# Patient Record
Sex: Female | Born: 1975
Health system: Southern US, Community
[De-identification: ages and names within clinical notes are randomized; demographics above are authoritative.]

## PROBLEM LIST (undated history)

## (undated) DIAGNOSIS — Z803 Family history of malignant neoplasm of breast: Secondary | ICD-10-CM

## (undated) DIAGNOSIS — C439 Malignant melanoma of skin, unspecified: Secondary | ICD-10-CM

## (undated) DIAGNOSIS — Z800 Family history of malignant neoplasm of digestive organs: Secondary | ICD-10-CM

## (undated) DIAGNOSIS — Z808 Family history of malignant neoplasm of other organs or systems: Secondary | ICD-10-CM

## (undated) HISTORY — PX: APPENDECTOMY: SHX54

## (undated) HISTORY — DX: Family history of malignant neoplasm of breast: Z80.3

## (undated) HISTORY — DX: Family history of malignant neoplasm of digestive organs: Z80.0

## (undated) HISTORY — DX: Family history of malignant neoplasm of other organs or systems: Z80.8

## (undated) HISTORY — PX: CHOLECYSTECTOMY: SHX55

---

## 2007-05-11 ENCOUNTER — Emergency Department (HOSPITAL_COMMUNITY): Admission: EM | Admit: 2007-05-11 | Discharge: 2007-05-11 | Payer: Self-pay | Admitting: Emergency Medicine

## 2007-08-30 ENCOUNTER — Other Ambulatory Visit: Admission: RE | Admit: 2007-08-30 | Discharge: 2007-08-30 | Payer: Self-pay | Admitting: Family Medicine

## 2009-10-13 ENCOUNTER — Emergency Department (HOSPITAL_BASED_OUTPATIENT_CLINIC_OR_DEPARTMENT_OTHER): Admission: EM | Admit: 2009-10-13 | Discharge: 2009-10-13 | Payer: Self-pay | Admitting: Emergency Medicine

## 2009-10-13 ENCOUNTER — Ambulatory Visit: Payer: Self-pay | Admitting: Diagnostic Radiology

## 2010-12-28 LAB — OVA AND PARASITE EXAMINATION

## 2010-12-28 LAB — URINALYSIS, ROUTINE W REFLEX MICROSCOPIC
Bilirubin Urine: NEGATIVE
Glucose, UA: NEGATIVE mg/dL
Hgb urine dipstick: NEGATIVE
Ketones, ur: NEGATIVE mg/dL
pH: 7 (ref 5.0–8.0)

## 2010-12-28 LAB — CBC
Hemoglobin: 14 g/dL (ref 12.0–15.0)
Platelets: 224 10*3/uL (ref 150–400)
RBC: 4.74 MIL/uL (ref 3.87–5.11)

## 2010-12-28 LAB — BASIC METABOLIC PANEL
BUN: 13 mg/dL (ref 6–23)
CO2: 22 mEq/L (ref 19–32)
Calcium: 9.7 mg/dL (ref 8.4–10.5)
Chloride: 106 mEq/L (ref 96–112)
Creatinine, Ser: 0.8 mg/dL (ref 0.4–1.2)
Glucose, Bld: 102 mg/dL — ABNORMAL HIGH (ref 70–99)

## 2010-12-28 LAB — DIFFERENTIAL
Basophils Absolute: 0.5 10*3/uL — ABNORMAL HIGH (ref 0.0–0.1)
Basophils Relative: 2 % — ABNORMAL HIGH (ref 0–1)
Lymphs Abs: 1.2 10*3/uL (ref 0.7–4.0)
Monocytes Absolute: 1.3 10*3/uL — ABNORMAL HIGH (ref 0.1–1.0)

## 2010-12-28 LAB — STOOL CULTURE

## 2010-12-28 LAB — URINE MICROSCOPIC-ADD ON

## 2010-12-28 LAB — PREGNANCY, URINE: Preg Test, Ur: NEGATIVE

## 2014-11-06 ENCOUNTER — Emergency Department (HOSPITAL_BASED_OUTPATIENT_CLINIC_OR_DEPARTMENT_OTHER): Payer: Worker's Compensation

## 2014-11-06 ENCOUNTER — Emergency Department (HOSPITAL_BASED_OUTPATIENT_CLINIC_OR_DEPARTMENT_OTHER)
Admission: EM | Admit: 2014-11-06 | Discharge: 2014-11-06 | Disposition: A | Discharge status: Home / Self Care | Payer: Worker's Compensation | Attending: Emergency Medicine | Admitting: Emergency Medicine

## 2014-11-06 ENCOUNTER — Encounter (HOSPITAL_BASED_OUTPATIENT_CLINIC_OR_DEPARTMENT_OTHER): Payer: Self-pay | Admitting: *Deleted

## 2014-11-06 DIAGNOSIS — Z79899 Other long term (current) drug therapy: Secondary | ICD-10-CM | POA: Diagnosis not present

## 2014-11-06 DIAGNOSIS — S060X0A Concussion without loss of consciousness, initial encounter: Secondary | ICD-10-CM

## 2014-11-06 DIAGNOSIS — W07XXXA Fall from chair, initial encounter: Secondary | ICD-10-CM | POA: Diagnosis not present

## 2014-11-06 DIAGNOSIS — Y990 Civilian activity done for income or pay: Secondary | ICD-10-CM | POA: Insufficient documentation

## 2014-11-06 DIAGNOSIS — H538 Other visual disturbances: Secondary | ICD-10-CM | POA: Insufficient documentation

## 2014-11-06 DIAGNOSIS — Y9389 Activity, other specified: Secondary | ICD-10-CM | POA: Diagnosis not present

## 2014-11-06 DIAGNOSIS — Y9289 Other specified places as the place of occurrence of the external cause: Secondary | ICD-10-CM | POA: Diagnosis not present

## 2014-11-06 DIAGNOSIS — S0990XA Unspecified injury of head, initial encounter: Secondary | ICD-10-CM | POA: Diagnosis present

## 2014-11-06 DIAGNOSIS — Z85820 Personal history of malignant melanoma of skin: Secondary | ICD-10-CM | POA: Diagnosis not present

## 2014-11-06 HISTORY — DX: Malignant melanoma of skin, unspecified: C43.9

## 2014-11-06 MED ORDER — IBUPROFEN 800 MG PO TABS
800.0000 mg | ORAL_TABLET | Freq: Once | ORAL | Status: AC
  Administered 2014-11-06: 800 mg via ORAL
  Filled 2014-11-06: qty 1

## 2014-11-06 NOTE — ED Notes (Signed)
Fell off stool at work and hit head, denies LOC. C/o nausea and dizziness.

## 2014-11-06 NOTE — ED Provider Notes (Signed)
CSN: [id number]     Arrival date & time 11/06/14  1201 History   First MD Initiated Contact with Patient 11/06/14 1217     Chief Complaint  Patient presents with  . Head Injury    Patient is a 39 y.o. female presenting with head injury. The history is provided by the patient.  Head Injury Location:  Occipital Mechanism of injury: fall   Pain details:    Quality:  Aching   Severity:  Severe   Timing:  Constant   Progression:  Worsening Chronicity:  New Relieved by:  Nothing Worsened by:  Light and movement Associated symptoms: blurred vision, headache and nausea   Associated symptoms: no double vision, no focal weakness, no loss of consciousness, no neck pain, no seizures and no vomiting   Patient reports that while at work earlier this morning the chair broke and she fell back hitting her head on the wall.  No LOC.  No vomiting.  No focal weakness.  She does not take anticoagulants.  She reports severe HA/dizziness.  She reports h/o migraines She works at a private school.  She was instructed to be seen in the ER  Past Medical History  Diagnosis Date  . Melanoma    Past Surgical History  Procedure Laterality Date  . Cholecystectomy    . Appendectomy    . Cesarean section     History reviewed. No pertinent family history. History  Substance Use Topics  . Smoking status: Never Smoker   . Smokeless tobacco: Not on file  . Alcohol Use: No   OB History    No data available     Review of Systems  Eyes: Positive for blurred vision and visual disturbance. Negative for double vision.  Cardiovascular: Negative for chest pain.  Gastrointestinal: Positive for nausea. Negative for vomiting.  Musculoskeletal: Negative for neck pain.  Neurological: Positive for dizziness and headaches. Negative for focal weakness, seizures, loss of consciousness and syncope.  All other systems reviewed and are negative.     Allergies  Reglan  Home Medications   Prior to Admission  medications   Medication Sig Start Date End Date Taking? Authorizing Provider  cetirizine (ZYRTEC) 10 MG tablet Take 10 mg by mouth daily.   Yes Historical Provider, MD  FLUoxetine (PROZAC) 20 MG capsule Take 20 mg by mouth daily.   Yes Historical Provider, MD  Melatonin 3 MG CAPS Take by mouth.   Yes Historical Provider, MD   BP 132/73 mmHg  Pulse 62  Temp(Src) 98.7 F (37.1 C) (Oral)  Resp 20  Ht 5\' 5"  (1.651 m)  Wt 245 lb (111.131 kg)  BMI 40.77 kg/m2  SpO2 99%  LMP 10/31/2014 Physical Exam CONSTITUTIONAL: Well developed/well nourished HEAD: Normocephalic/atraumatic, tenderness to occiput EYES: EOMI/PERRL, no nystagmus, no ptosis ENMT: Mucous membranes moist NECK: supple no meningeal signs, no bruits SPINE/BACK:entire spine nontender CV: S1/S2 noted, no murmurs/rubs/gallops noted LUNGS: Lungs are clear to auscultation bilaterally, no apparent distress ABDOMEN: soft, nontender, no rebound or guarding GU:no cva tenderness NEURO:Awake/alert, facies symmetric, no arm or leg drift is noted Equal 5/5 strength with shoulder abduction, elbow flex/extension, wrist flex/extension in upper extremities and equal hand grips bilaterally Equal 5/5 strength with hip flexion,knee flex/extension, foot dorsi/plantar flexion Cranial nerves 3/4/5/6/04/19/09/11/12 tested and intact Gait normal without ataxia No past pointing Sensation to light touch intact in all extremities EXTREMITIES: pulses normal, full ROM SKIN: warm, color normal PSYCH: no abnormalities of mood noted, alert and oriented to situation  ED Course  Procedures   ADVISED PATIENT THAT GIVEN MECHANISM, NO LOC, NO VOMITING WITH UNREMARKABLE NEURO EXAM, IT IS HIGHLY UNLIKELY SHE HAS ANY ACUTE NEUROSURGICAL EMERGENCY AND WE COULD TREAT HER HEADACHE AS A MIGRAINE.  ADVISED OF RISK OF CT SCANNING INCLUDING RADIATION EXPOSURE.  SHE REPORTS HER JOB WANTS HER TO HAVE CT HEAD AND SHE DEMANDS CT HEAD.  CT HEAD NEGATIVE ADVISED F/U WITH  NEUROLOGY FOR LIKELY CONCUSSION AS WELL F/U WITH OCCUPATIONAL HEALTH Imaging Review Ct Head Wo Contrast  11/06/2014   CLINICAL DATA:  [place] off stool at work and hit head on the wall , denies LOC. C/o nausea and dizziness.  EXAM: CT HEAD WITHOUT CONTRAST  TECHNIQUE: Contiguous axial images were obtained from the base of the skull through the vertex without intravenous contrast.  COMPARISON:  None.  FINDINGS: There is no evidence of mass effect, midline shift or extra-axial fluid collections. There is no evidence of a space-occupying lesion or intracranial hemorrhage. There is no evidence of a cortical-based area of acute infarction.  The ventricles and sulci are appropriate for the patient's age. The basal cisterns are patent.  Visualized portions of the orbits are unremarkable. The visualized portions of the paranasal sinuses and mastoid air cells are unremarkable.  The osseous structures are unremarkable.  IMPRESSION: Normal CT of the brain without intravenous contrast.   Electronically Signed   By: Kathreen Devoid   On: 11/06/2014 13:20   Medications  ibuprofen (ADVIL,MOTRIN) tablet 800 mg (800 mg Oral Given 11/06/14 1258)     MDM   Final diagnoses:  Concussion, without loss of consciousness, initial encounter    Nursing notes including past medical history and social history reviewed and considered in documentation     Sharyon Cable, MD 11/06/14 1418

## 2014-11-06 NOTE — Discharge Instructions (Signed)

## 2014-11-06 NOTE — ED Notes (Signed)
MD at bedside. 

## 2014-11-16 ENCOUNTER — Other Ambulatory Visit (HOSPITAL_BASED_OUTPATIENT_CLINIC_OR_DEPARTMENT_OTHER): Payer: Self-pay | Admitting: Physician Assistant

## 2014-11-16 ENCOUNTER — Ambulatory Visit (HOSPITAL_BASED_OUTPATIENT_CLINIC_OR_DEPARTMENT_OTHER)
Admission: RE | Admit: 2014-11-16 | Discharge: 2014-11-16 | Disposition: A | Discharge status: Home / Self Care | Payer: Worker's Compensation | Source: Ambulatory Visit | Attending: Physician Assistant | Admitting: Physician Assistant

## 2014-11-16 DIAGNOSIS — S0990XA Unspecified injury of head, initial encounter: Secondary | ICD-10-CM | POA: Diagnosis not present

## 2014-11-16 DIAGNOSIS — W1809XA Striking against other object with subsequent fall, initial encounter: Secondary | ICD-10-CM | POA: Insufficient documentation

## 2016-11-20 DIAGNOSIS — H10413 Chronic giant papillary conjunctivitis, bilateral: Secondary | ICD-10-CM | POA: Diagnosis not present

## 2017-01-27 DIAGNOSIS — Z1283 Encounter for screening for malignant neoplasm of skin: Secondary | ICD-10-CM | POA: Diagnosis not present

## 2017-01-27 DIAGNOSIS — Z08 Encounter for follow-up examination after completed treatment for malignant neoplasm: Secondary | ICD-10-CM | POA: Diagnosis not present

## 2017-01-27 DIAGNOSIS — Z85820 Personal history of malignant melanoma of skin: Secondary | ICD-10-CM | POA: Diagnosis not present

## 2017-04-21 DIAGNOSIS — Z85820 Personal history of malignant melanoma of skin: Secondary | ICD-10-CM | POA: Diagnosis not present

## 2017-04-21 DIAGNOSIS — Z08 Encounter for follow-up examination after completed treatment for malignant neoplasm: Secondary | ICD-10-CM | POA: Diagnosis not present

## 2017-05-12 DIAGNOSIS — Z0000 Encounter for general adult medical examination without abnormal findings: Secondary | ICD-10-CM | POA: Diagnosis not present

## 2017-05-12 DIAGNOSIS — E7800 Pure hypercholesterolemia, unspecified: Secondary | ICD-10-CM | POA: Diagnosis not present

## 2017-06-18 DIAGNOSIS — J019 Acute sinusitis, unspecified: Secondary | ICD-10-CM | POA: Diagnosis not present

## 2017-06-24 DIAGNOSIS — J019 Acute sinusitis, unspecified: Secondary | ICD-10-CM | POA: Diagnosis not present

## 2017-07-21 DIAGNOSIS — Z85820 Personal history of malignant melanoma of skin: Secondary | ICD-10-CM | POA: Diagnosis not present

## 2017-07-21 DIAGNOSIS — Z1283 Encounter for screening for malignant neoplasm of skin: Secondary | ICD-10-CM | POA: Diagnosis not present

## 2017-07-21 DIAGNOSIS — Z08 Encounter for follow-up examination after completed treatment for malignant neoplasm: Secondary | ICD-10-CM | POA: Diagnosis not present

## 2017-07-23 DIAGNOSIS — H5213 Myopia, bilateral: Secondary | ICD-10-CM | POA: Diagnosis not present

## 2017-10-20 DIAGNOSIS — D225 Melanocytic nevi of trunk: Secondary | ICD-10-CM | POA: Diagnosis not present

## 2017-10-20 DIAGNOSIS — Z85820 Personal history of malignant melanoma of skin: Secondary | ICD-10-CM | POA: Diagnosis not present

## 2017-10-20 DIAGNOSIS — Z1283 Encounter for screening for malignant neoplasm of skin: Secondary | ICD-10-CM | POA: Diagnosis not present

## 2017-10-20 DIAGNOSIS — D485 Neoplasm of uncertain behavior of skin: Secondary | ICD-10-CM | POA: Diagnosis not present

## 2017-10-20 DIAGNOSIS — Z08 Encounter for follow-up examination after completed treatment for malignant neoplasm: Secondary | ICD-10-CM | POA: Diagnosis not present

## 2017-11-03 DIAGNOSIS — D485 Neoplasm of uncertain behavior of skin: Secondary | ICD-10-CM | POA: Diagnosis not present

## 2017-11-03 DIAGNOSIS — L988 Other specified disorders of the skin and subcutaneous tissue: Secondary | ICD-10-CM | POA: Diagnosis not present

## 2017-12-09 DIAGNOSIS — M5432 Sciatica, left side: Secondary | ICD-10-CM | POA: Diagnosis not present

## 2017-12-09 DIAGNOSIS — M5431 Sciatica, right side: Secondary | ICD-10-CM | POA: Diagnosis not present

## 2017-12-09 DIAGNOSIS — M5481 Occipital neuralgia: Secondary | ICD-10-CM | POA: Diagnosis not present

## 2018-01-12 DIAGNOSIS — Z85820 Personal history of malignant melanoma of skin: Secondary | ICD-10-CM | POA: Diagnosis not present

## 2018-01-12 DIAGNOSIS — Z08 Encounter for follow-up examination after completed treatment for malignant neoplasm: Secondary | ICD-10-CM | POA: Diagnosis not present

## 2018-01-12 DIAGNOSIS — Z1283 Encounter for screening for malignant neoplasm of skin: Secondary | ICD-10-CM | POA: Diagnosis not present

## 2018-03-02 DIAGNOSIS — M5432 Sciatica, left side: Secondary | ICD-10-CM | POA: Diagnosis not present

## 2018-03-02 DIAGNOSIS — M5431 Sciatica, right side: Secondary | ICD-10-CM | POA: Diagnosis not present

## 2018-03-02 DIAGNOSIS — M5481 Occipital neuralgia: Secondary | ICD-10-CM | POA: Diagnosis not present

## 2018-03-25 DIAGNOSIS — M5481 Occipital neuralgia: Secondary | ICD-10-CM | POA: Diagnosis not present

## 2018-03-25 DIAGNOSIS — M5432 Sciatica, left side: Secondary | ICD-10-CM | POA: Diagnosis not present

## 2018-03-25 DIAGNOSIS — M5431 Sciatica, right side: Secondary | ICD-10-CM | POA: Diagnosis not present

## 2018-03-28 DIAGNOSIS — M5431 Sciatica, right side: Secondary | ICD-10-CM | POA: Diagnosis not present

## 2018-03-28 DIAGNOSIS — M5432 Sciatica, left side: Secondary | ICD-10-CM | POA: Diagnosis not present

## 2018-03-28 DIAGNOSIS — M5481 Occipital neuralgia: Secondary | ICD-10-CM | POA: Diagnosis not present

## 2018-04-13 ENCOUNTER — Other Ambulatory Visit: Payer: Self-pay | Admitting: Family Medicine

## 2018-04-13 ENCOUNTER — Other Ambulatory Visit (HOSPITAL_COMMUNITY)
Admission: RE | Admit: 2018-04-13 | Discharge: 2018-04-13 | Disposition: A | Discharge status: Home / Self Care | Payer: Commercial Managed Care - PPO | Source: Ambulatory Visit | Attending: Family Medicine | Admitting: Family Medicine

## 2018-04-13 DIAGNOSIS — Z124 Encounter for screening for malignant neoplasm of cervix: Secondary | ICD-10-CM | POA: Insufficient documentation

## 2018-04-13 DIAGNOSIS — E7800 Pure hypercholesterolemia, unspecified: Secondary | ICD-10-CM | POA: Diagnosis not present

## 2018-04-13 DIAGNOSIS — Z0000 Encounter for general adult medical examination without abnormal findings: Secondary | ICD-10-CM | POA: Diagnosis not present

## 2018-04-13 DIAGNOSIS — Z1231 Encounter for screening mammogram for malignant neoplasm of breast: Secondary | ICD-10-CM

## 2018-04-21 LAB — CYTOLOGY - PAP
DIAGNOSIS: NEGATIVE
HPV: NOT DETECTED

## 2018-05-09 ENCOUNTER — Ambulatory Visit
Admission: RE | Admit: 2018-05-09 | Discharge: 2018-05-09 | Disposition: A | Discharge status: Home / Self Care | Payer: Commercial Managed Care - PPO | Source: Ambulatory Visit | Attending: Family Medicine | Admitting: Family Medicine

## 2018-05-09 DIAGNOSIS — Z1231 Encounter for screening mammogram for malignant neoplasm of breast: Secondary | ICD-10-CM

## 2018-05-16 DIAGNOSIS — Z08 Encounter for follow-up examination after completed treatment for malignant neoplasm: Secondary | ICD-10-CM | POA: Diagnosis not present

## 2018-05-16 DIAGNOSIS — Z85820 Personal history of malignant melanoma of skin: Secondary | ICD-10-CM | POA: Diagnosis not present

## 2018-05-16 DIAGNOSIS — Z1283 Encounter for screening for malignant neoplasm of skin: Secondary | ICD-10-CM | POA: Diagnosis not present

## 2018-06-23 DIAGNOSIS — M5432 Sciatica, left side: Secondary | ICD-10-CM | POA: Diagnosis not present

## 2018-06-23 DIAGNOSIS — M5431 Sciatica, right side: Secondary | ICD-10-CM | POA: Diagnosis not present

## 2018-06-23 DIAGNOSIS — M5481 Occipital neuralgia: Secondary | ICD-10-CM | POA: Diagnosis not present

## 2018-06-24 DIAGNOSIS — M5431 Sciatica, right side: Secondary | ICD-10-CM | POA: Diagnosis not present

## 2018-06-24 DIAGNOSIS — M5432 Sciatica, left side: Secondary | ICD-10-CM | POA: Diagnosis not present

## 2018-06-24 DIAGNOSIS — M5481 Occipital neuralgia: Secondary | ICD-10-CM | POA: Diagnosis not present

## 2018-06-27 DIAGNOSIS — M5481 Occipital neuralgia: Secondary | ICD-10-CM | POA: Diagnosis not present

## 2018-06-27 DIAGNOSIS — M5432 Sciatica, left side: Secondary | ICD-10-CM | POA: Diagnosis not present

## 2018-06-27 DIAGNOSIS — M5431 Sciatica, right side: Secondary | ICD-10-CM | POA: Diagnosis not present

## 2018-07-26 DIAGNOSIS — H52222 Regular astigmatism, left eye: Secondary | ICD-10-CM | POA: Diagnosis not present

## 2018-07-26 DIAGNOSIS — H5213 Myopia, bilateral: Secondary | ICD-10-CM | POA: Diagnosis not present

## 2018-07-26 DIAGNOSIS — H10413 Chronic giant papillary conjunctivitis, bilateral: Secondary | ICD-10-CM | POA: Diagnosis not present

## 2018-12-07 DIAGNOSIS — Z08 Encounter for follow-up examination after completed treatment for malignant neoplasm: Secondary | ICD-10-CM | POA: Diagnosis not present

## 2018-12-07 DIAGNOSIS — L821 Other seborrheic keratosis: Secondary | ICD-10-CM | POA: Diagnosis not present

## 2018-12-07 DIAGNOSIS — Z85820 Personal history of malignant melanoma of skin: Secondary | ICD-10-CM | POA: Diagnosis not present

## 2018-12-07 DIAGNOSIS — D225 Melanocytic nevi of trunk: Secondary | ICD-10-CM | POA: Diagnosis not present

## 2019-01-16 DIAGNOSIS — D225 Melanocytic nevi of trunk: Secondary | ICD-10-CM | POA: Diagnosis not present

## 2019-02-07 DIAGNOSIS — M5431 Sciatica, right side: Secondary | ICD-10-CM | POA: Diagnosis not present

## 2019-02-07 DIAGNOSIS — M5432 Sciatica, left side: Secondary | ICD-10-CM | POA: Diagnosis not present

## 2019-02-07 DIAGNOSIS — M5481 Occipital neuralgia: Secondary | ICD-10-CM | POA: Diagnosis not present

## 2019-02-09 DIAGNOSIS — M5431 Sciatica, right side: Secondary | ICD-10-CM | POA: Diagnosis not present

## 2019-02-09 DIAGNOSIS — M5432 Sciatica, left side: Secondary | ICD-10-CM | POA: Diagnosis not present

## 2019-02-09 DIAGNOSIS — M5481 Occipital neuralgia: Secondary | ICD-10-CM | POA: Diagnosis not present

## 2019-02-13 DIAGNOSIS — M5481 Occipital neuralgia: Secondary | ICD-10-CM | POA: Diagnosis not present

## 2019-02-13 DIAGNOSIS — M5432 Sciatica, left side: Secondary | ICD-10-CM | POA: Diagnosis not present

## 2019-02-13 DIAGNOSIS — M5431 Sciatica, right side: Secondary | ICD-10-CM | POA: Diagnosis not present

## 2019-02-16 DIAGNOSIS — M5431 Sciatica, right side: Secondary | ICD-10-CM | POA: Diagnosis not present

## 2019-02-16 DIAGNOSIS — M5481 Occipital neuralgia: Secondary | ICD-10-CM | POA: Diagnosis not present

## 2019-02-16 DIAGNOSIS — M5432 Sciatica, left side: Secondary | ICD-10-CM | POA: Diagnosis not present

## 2019-02-21 DIAGNOSIS — M5481 Occipital neuralgia: Secondary | ICD-10-CM | POA: Diagnosis not present

## 2019-02-21 DIAGNOSIS — M5431 Sciatica, right side: Secondary | ICD-10-CM | POA: Diagnosis not present

## 2019-02-21 DIAGNOSIS — M5432 Sciatica, left side: Secondary | ICD-10-CM | POA: Diagnosis not present

## 2019-04-07 ENCOUNTER — Other Ambulatory Visit: Payer: Self-pay | Admitting: Family Medicine

## 2019-04-07 DIAGNOSIS — Z1231 Encounter for screening mammogram for malignant neoplasm of breast: Secondary | ICD-10-CM

## 2019-05-18 ENCOUNTER — Other Ambulatory Visit: Payer: Self-pay

## 2019-05-18 ENCOUNTER — Ambulatory Visit
Admission: RE | Admit: 2019-05-18 | Discharge: 2019-05-18 | Disposition: A | Discharge status: Home / Self Care | Payer: Commercial Managed Care - PPO | Source: Ambulatory Visit | Attending: Family Medicine | Admitting: Family Medicine

## 2019-05-18 DIAGNOSIS — Z1231 Encounter for screening mammogram for malignant neoplasm of breast: Secondary | ICD-10-CM

## 2019-07-11 ENCOUNTER — Other Ambulatory Visit: Payer: Self-pay

## 2019-07-11 DIAGNOSIS — Z20822 Contact with and (suspected) exposure to covid-19: Secondary | ICD-10-CM

## 2019-07-12 LAB — NOVEL CORONAVIRUS, NAA: SARS-CoV-2, NAA: NOT DETECTED

## 2019-10-12 ENCOUNTER — Ambulatory Visit: Payer: Commercial Managed Care - PPO | Attending: Internal Medicine

## 2019-10-12 ENCOUNTER — Other Ambulatory Visit: Payer: Commercial Managed Care - PPO

## 2019-10-12 DIAGNOSIS — Z20822 Contact with and (suspected) exposure to covid-19: Secondary | ICD-10-CM

## 2019-10-14 LAB — NOVEL CORONAVIRUS, NAA: SARS-CoV-2, NAA: NOT DETECTED

## 2019-11-10 ENCOUNTER — Ambulatory Visit: Payer: Commercial Managed Care - PPO | Attending: Internal Medicine

## 2019-11-10 DIAGNOSIS — Z20822 Contact with and (suspected) exposure to covid-19: Secondary | ICD-10-CM

## 2019-11-11 LAB — NOVEL CORONAVIRUS, NAA: SARS-CoV-2, NAA: NOT DETECTED

## 2019-12-18 ENCOUNTER — Ambulatory Visit: Payer: Commercial Managed Care - PPO | Attending: Internal Medicine

## 2019-12-18 DIAGNOSIS — Z20822 Contact with and (suspected) exposure to covid-19: Secondary | ICD-10-CM

## 2019-12-19 LAB — NOVEL CORONAVIRUS, NAA: SARS-CoV-2, NAA: NOT DETECTED

## 2020-03-30 ENCOUNTER — Emergency Department: Admission: EM | Admit: 2020-03-30 | Discharge status: Absent without leave | Payer: Self-pay

## 2020-05-29 ENCOUNTER — Other Ambulatory Visit (HOSPITAL_BASED_OUTPATIENT_CLINIC_OR_DEPARTMENT_OTHER): Payer: Self-pay | Admitting: Family Medicine

## 2020-05-29 DIAGNOSIS — Z1231 Encounter for screening mammogram for malignant neoplasm of breast: Secondary | ICD-10-CM

## 2020-05-31 ENCOUNTER — Ambulatory Visit (HOSPITAL_BASED_OUTPATIENT_CLINIC_OR_DEPARTMENT_OTHER)
Admission: RE | Admit: 2020-05-31 | Discharge: 2020-05-31 | Disposition: A | Discharge status: Home / Self Care | Payer: Commercial Managed Care - PPO | Source: Ambulatory Visit | Attending: Family Medicine | Admitting: Family Medicine

## 2020-05-31 ENCOUNTER — Other Ambulatory Visit: Payer: Self-pay

## 2020-05-31 ENCOUNTER — Encounter (HOSPITAL_BASED_OUTPATIENT_CLINIC_OR_DEPARTMENT_OTHER): Payer: Commercial Managed Care - PPO

## 2020-05-31 DIAGNOSIS — Z1231 Encounter for screening mammogram for malignant neoplasm of breast: Secondary | ICD-10-CM

## 2021-05-29 ENCOUNTER — Other Ambulatory Visit (HOSPITAL_BASED_OUTPATIENT_CLINIC_OR_DEPARTMENT_OTHER): Payer: Self-pay | Admitting: Family Medicine

## 2021-05-29 DIAGNOSIS — Z1231 Encounter for screening mammogram for malignant neoplasm of breast: Secondary | ICD-10-CM

## 2021-06-05 ENCOUNTER — Ambulatory Visit (INDEPENDENT_AMBULATORY_CARE_PROVIDER_SITE_OTHER): Payer: Commercial Managed Care - PPO

## 2021-06-05 ENCOUNTER — Other Ambulatory Visit: Payer: Self-pay

## 2021-06-05 DIAGNOSIS — Z1231 Encounter for screening mammogram for malignant neoplasm of breast: Secondary | ICD-10-CM | POA: Diagnosis not present

## 2021-07-23 IMAGING — MG DIGITAL SCREENING BILAT W/ TOMO W/ CAD
8 series · 8 of 24 positions shown · non-contrast
Comparison: Previous exam(s).

CLINICAL DATA: Screening.

EXAM:
DIGITAL SCREENING BILATERAL MAMMOGRAM WITH TOMO AND CAD

[R CC synth-2D]
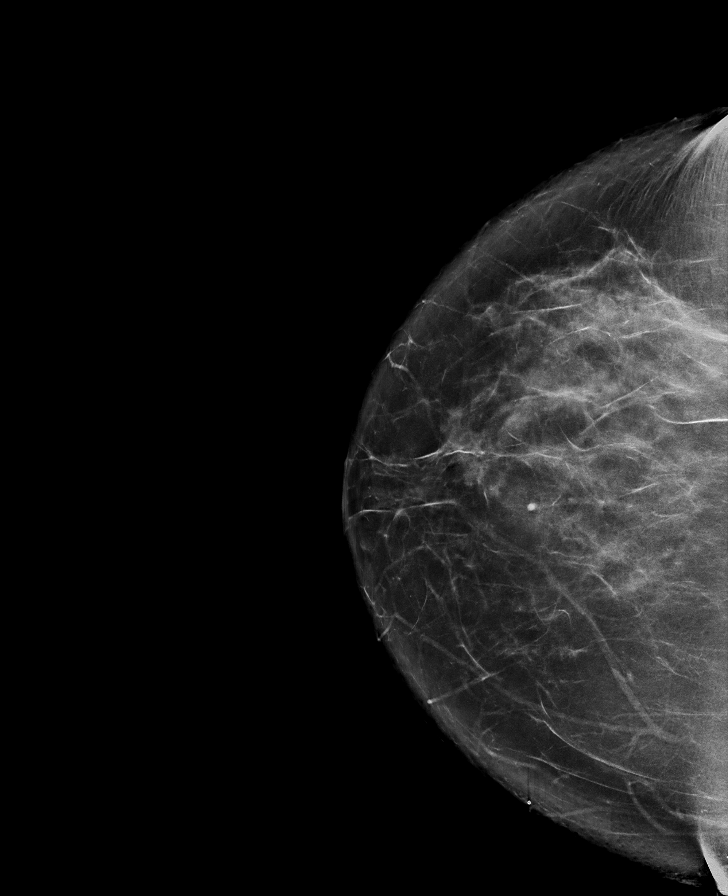

[R MLO synth-2D]
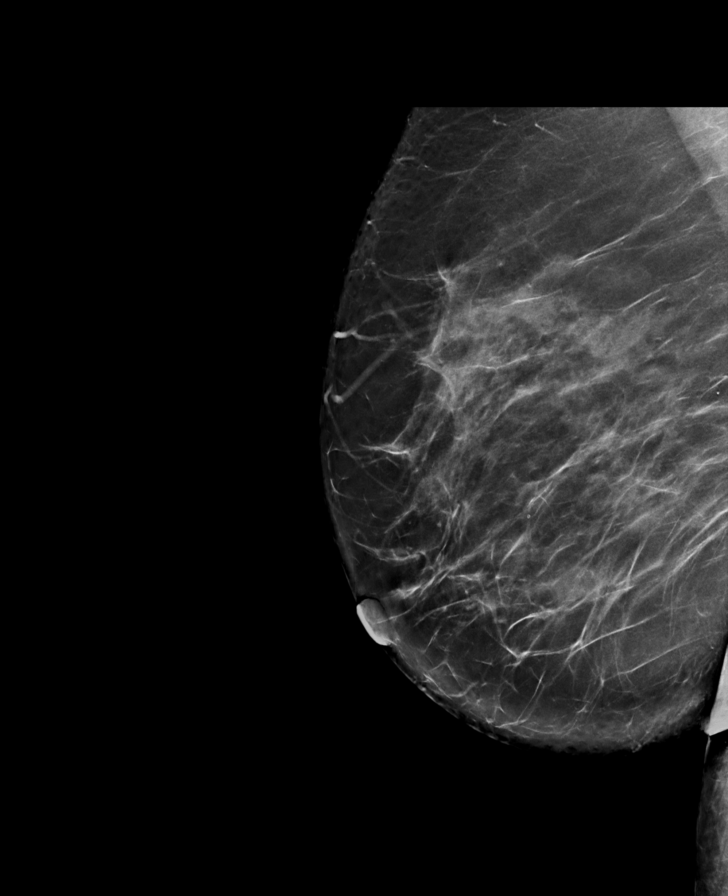

[L MLO synth-2D]
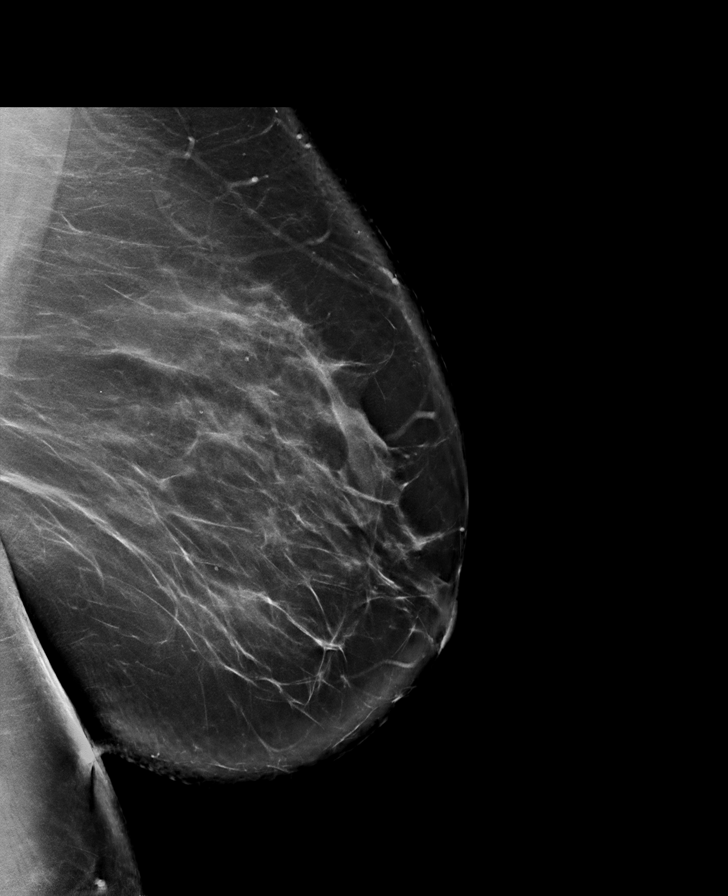

[L CC synth-2D]
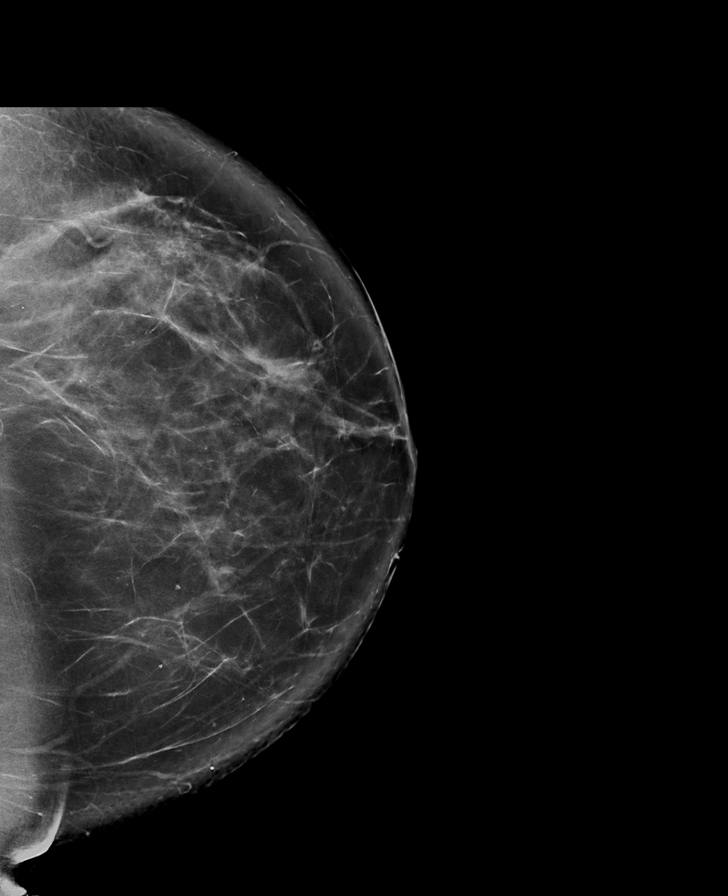

[L CC tomo · tomo slice 51/101.0]
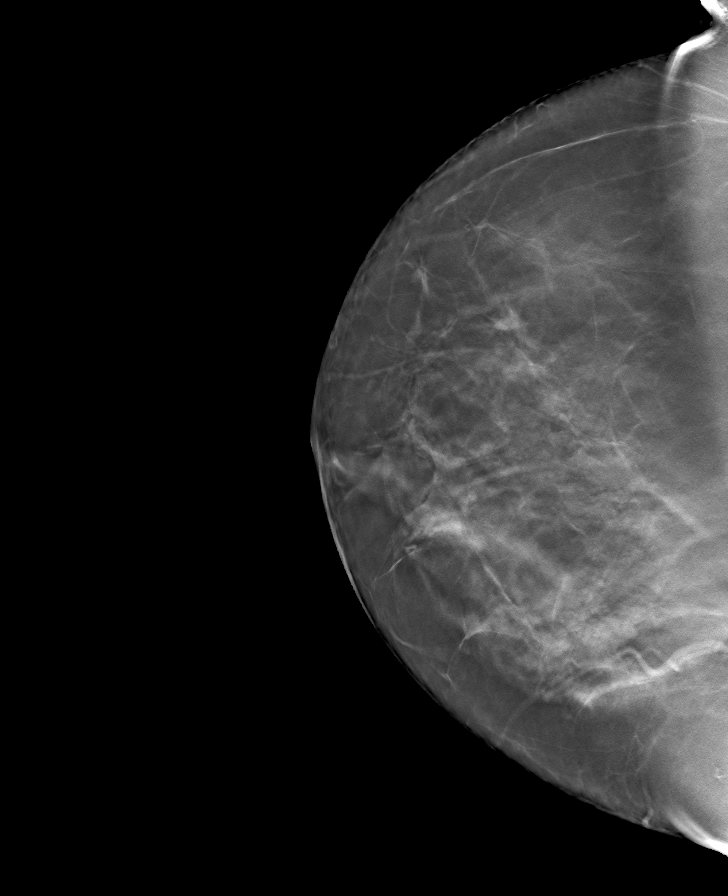

[L MLO tomo · tomo slice 57/114.0]
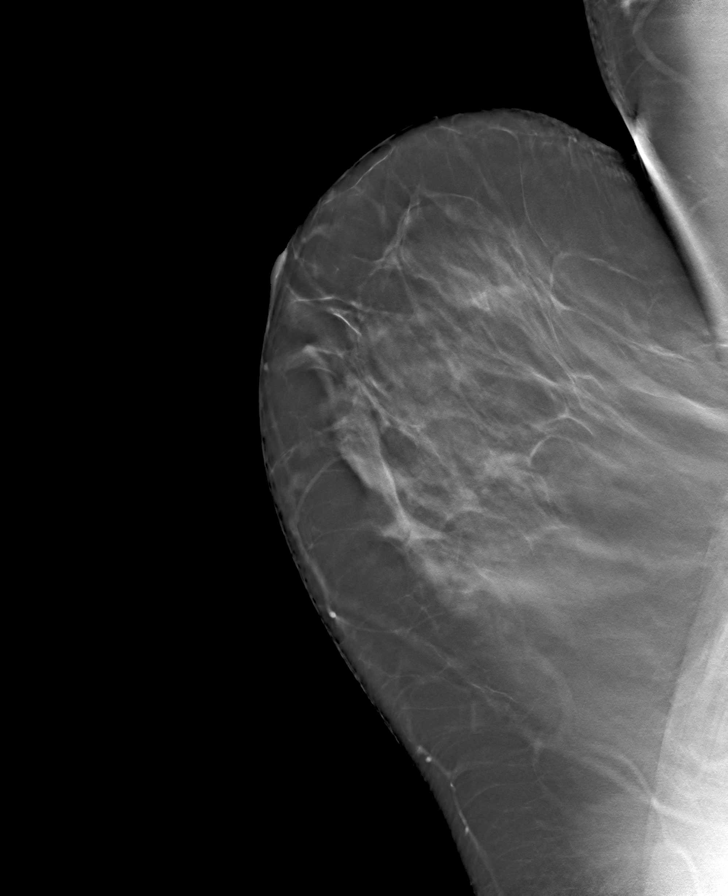

[R MLO tomo · tomo slice 51/101.0]
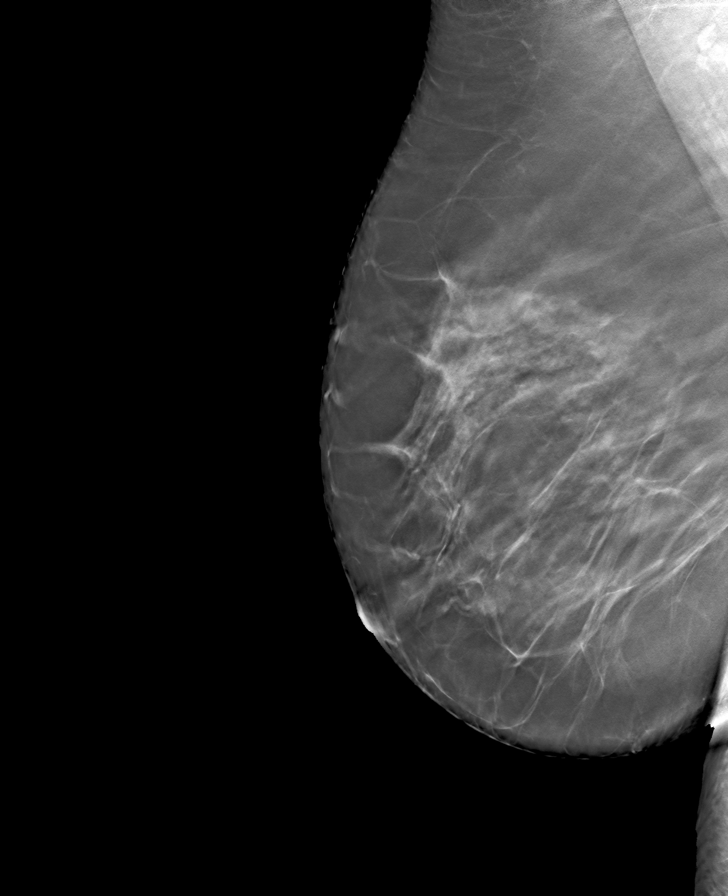

[R CC tomo · tomo slice 50/99.0]
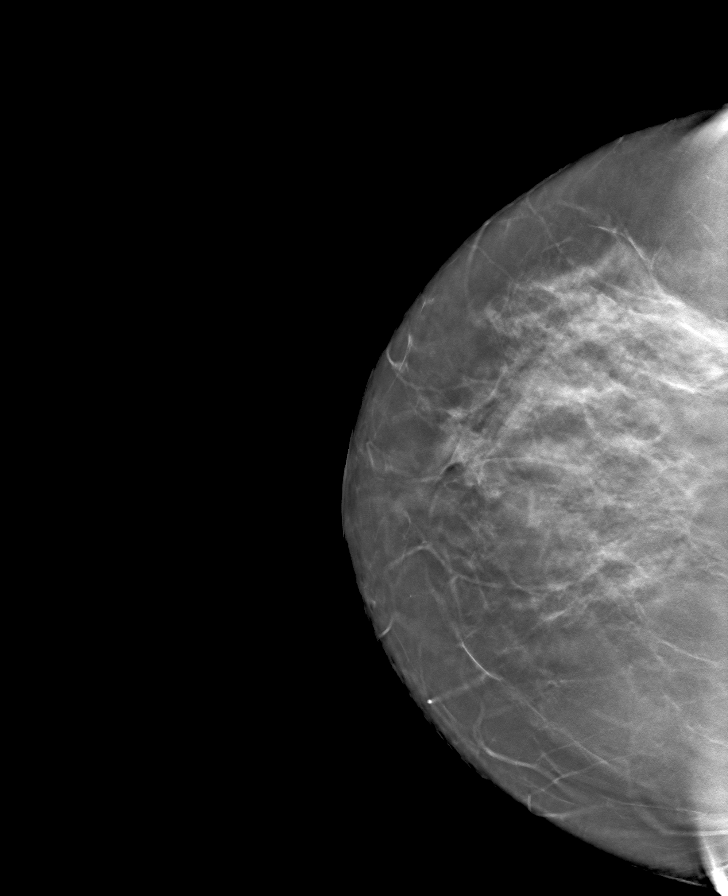

[8 of 24 positions shown; findings below may reference images not displayed]

ACR Breast Density Category b: There are scattered areas of
fibroglandular density.
FINDINGS: There are no findings suspicious for malignancy. Images were
processed with CAD.
IMPRESSION: No mammographic evidence of malignancy. A result letter of this
screening mammogram will be mailed directly to the patient.

RECOMMENDATION:
Screening mammogram in one year. (Code:[id number])

BI-RADS CATEGORY  1: Negative.

## 2021-11-16 ENCOUNTER — Encounter: Payer: Self-pay | Admitting: Emergency Medicine

## 2021-11-16 ENCOUNTER — Emergency Department (INDEPENDENT_AMBULATORY_CARE_PROVIDER_SITE_OTHER)
Admission: EM | Admit: 2021-11-16 | Discharge: 2021-11-16 | Disposition: A | Discharge status: Home / Self Care | Payer: Commercial Managed Care - PPO | Source: Home / Self Care | Attending: Family Medicine | Admitting: Family Medicine

## 2021-11-16 DIAGNOSIS — J209 Acute bronchitis, unspecified: Secondary | ICD-10-CM | POA: Diagnosis not present

## 2021-11-16 MED ORDER — PREDNISONE 20 MG PO TABS: 20.0000 mg | ORAL_TABLET | Freq: Two times a day (BID) | ORAL | 0 refills | Status: DC

## 2021-11-16 MED ORDER — AZITHROMYCIN 250 MG PO TABS: ORAL_TABLET | ORAL | 0 refills | Status: DC

## 2021-11-16 NOTE — ED Triage Notes (Signed)
Patient presents to Urgent Care with complaints of cough with some phlegm since 2 weeks ago. Patient reports having a sore throat in the beginning. The cough is worst at night, difficult to take a deep breath. Took Mucinex, Delysm and left over Tessalon pearls. Today feeling worst

## 2021-11-16 NOTE — Discharge Instructions (Signed)
Take azithromycin as directed, 2 pills today then 1 a day until gone Take prednisone 2 times a day for 5 days.  Take 2 doses today Drink lots of fluids.  Continue to run humidifier Take over-the-counter cough medicine like Delsym or Mucinex DM as needed Call or return if no better by the end of the week, here or your primary care

## 2021-11-16 NOTE — ED Provider Notes (Signed)
[facility]    CSN: [id number] Arrival date & time: 11/16/21  1222      History   Chief Complaint Chief Complaint  Patient presents with   Cough    HPI Megan Kennedy is a 46 y.o. female.   HPI  Patient has had a cough for 2 weeks.  Started off with a cold and sore throat.  She has a Radio producer.  She has been finding it with over-the-counter medications, rest, humidifier, increased fluids.  In spite of this she continues to cough.  Her chest hurts from the coughing.  The last couple of days her chest feels tight.  She feels like she cannot take a deep breath without coughing.  No history of cigarette smoking.  No history of lung disease or asthma.  No known exposure to COVID  Past Medical History:  Diagnosis Date   Melanoma ([place])     There are no problems to display for this patient.   Past Surgical History:  Procedure Laterality Date   APPENDECTOMY     CESAREAN SECTION     CHOLECYSTECTOMY      OB History   No obstetric history on file.      Home Medications    Prior to Admission medications   Medication Sig Start Date End Date Taking? Authorizing Provider  azithromycin (ZITHROMAX Z-PAK) 250 MG tablet Take two pills today followed by one a day until gone 11/16/21  Yes Raylene Everts, MD  FLUoxetine (PROZAC) 20 MG capsule Take 20 mg by mouth daily.   Yes [provider]  loratadine (CLARITIN) 10 MG tablet 1 tablet   Yes [provider]  pravastatin (PRAVACHOL) 40 MG tablet Take 40 mg by mouth daily. 11/03/21  Yes [provider]  predniSONE (DELTASONE) 20 MG tablet Take 1 tablet (20 mg total) by mouth 2 (two) times daily with a meal. 11/16/21  Yes Raylene Everts, MD  cetirizine (ZYRTEC) 10 MG tablet Take 10 mg by mouth daily.    [provider]  Melatonin 3 MG CAPS Take by mouth.    [provider]    Family History Family History  Problem Relation Age of Onset   Breast cancer Cousin      Social History Social History   Tobacco Use   Smoking status: Never  Substance Use Topics   Alcohol use: No   Drug use: No     Allergies   Reglan [metoclopramide]   Review of Systems Review of Systems  See HPI Physical Exam Triage Vital Signs ED Triage Vitals  Enc Vitals Group     BP 11/16/21 1409 127/80     Pulse Rate 11/16/21 1409 68     Resp 11/16/21 1409 18     Temp 11/16/21 1409 99.7 F (37.6 C)     Temp Source 11/16/21 1409 Oral     SpO2 11/16/21 1409 99 %     Weight --      Height --      Head Circumference --      Peak Flow --      Pain Score 11/16/21 1407 3     Pain Loc --      Pain Edu? --      Excl. in [place]? --    No data found.  Updated Vital Signs BP 127/80 (BP Location: Right Arm)    Pulse 68    Temp 99.7 F (37.6 C) (Oral)    Resp 18  LMP 11/16/2021 (Exact Date)    SpO2 99%      Physical Exam Constitutional:      General: She is not in acute distress.    Appearance: She is well-developed. She is ill-appearing.  HENT:     Head: Normocephalic and atraumatic.     Right Ear: Tympanic membrane and ear canal normal.     Left Ear: Tympanic membrane and ear canal normal.     Nose: Nose normal. No congestion.     Mouth/Throat:     Pharynx: Posterior oropharyngeal erythema present.  Eyes:     Conjunctiva/sclera: Conjunctivae normal.     Pupils: Pupils are equal, round, and reactive to light.  Cardiovascular:     Rate and Rhythm: Normal rate and regular rhythm.     Heart sounds: Normal heart sounds.  Pulmonary:     Effort: Pulmonary effort is normal. No respiratory distress.     Breath sounds: Rhonchi present.  Abdominal:     General: There is no distension.     Palpations: Abdomen is soft.  Musculoskeletal:        General: Normal range of motion.     Cervical back: Normal range of motion.  Skin:    General: Skin is warm and dry.  Neurological:     Mental Status: She is alert.  Psychiatric:        Mood and Affect: Mood normal.         Behavior: Behavior normal.     UC Treatments / Results  Labs (all labs ordered are listed, but only abnormal results are displayed) Labs Reviewed - No data to display  EKG   Radiology No results found.  Procedures Procedures (including critical care time)  Medications Ordered in UC Medications - No data to display  Initial Impression / Assessment and Plan / UC Course  I have reviewed the triage vital signs and the nursing notes.  Pertinent labs & imaging results that were available during my care of the patient were reviewed by me and considered in my medical decision making (see chart for details).     Patient has tried 2 weeks of conservative symptomatic management with no improvement.  We will treat with antibiotics and prednisone for the shortness of breath. Final Clinical Impressions(s) / UC Diagnoses   Final diagnoses:  Acute bronchitis, unspecified organism     Discharge Instructions      Take azithromycin as directed, 2 pills today then 1 a day until gone Take prednisone 2 times a day for 5 days.  Take 2 doses today Drink lots of fluids.  Continue to run humidifier Take over-the-counter cough medicine like Delsym or Mucinex DM as needed Call or return if no better by the end of the week, here or your primary care   ED Prescriptions     Medication Sig Dispense Auth. Provider   azithromycin (ZITHROMAX Z-PAK) 250 MG tablet Take two pills today followed by one a day until gone 6 tablet Raylene Everts, MD   predniSONE (DELTASONE) 20 MG tablet Take 1 tablet (20 mg total) by mouth 2 (two) times daily with a meal. 10 tablet Raylene Everts, MD      PDMP not reviewed this encounter.   Raylene Everts, MD 11/16/21 [phone]

## 2021-11-18 ENCOUNTER — Telehealth: Payer: Commercial Managed Care - PPO | Admitting: Nurse Practitioner

## 2021-11-18 DIAGNOSIS — J40 Bronchitis, not specified as acute or chronic: Secondary | ICD-10-CM | POA: Diagnosis not present

## 2021-11-18 MED ORDER — PROMETHAZINE-DM 6.25-15 MG/5ML PO SYRP: 5.0000 mL | ORAL_SOLUTION | Freq: Four times a day (QID) | ORAL | 0 refills | Status: DC | PRN

## 2021-11-18 MED ORDER — ALBUTEROL SULFATE HFA 108 (90 BASE) MCG/ACT IN AERS: 2.0000 | INHALATION_SPRAY | Freq: Four times a day (QID) | RESPIRATORY_TRACT | 0 refills | Status: DC | PRN

## 2021-11-18 NOTE — Progress Notes (Signed)
Virtual Visit Consent   Megan Kennedy, you are scheduled for a virtual visit with Megan Kennedy, [place], a [facility] provider, today.     Just as with appointments in the office, your consent must be obtained to participate.  Your consent will be active for this visit and any virtual visit you may have with one of our providers in the next 365 days.     If you have a MyChart account, a copy of this consent can be sent to you electronically.  All virtual visits are billed to your insurance company just like a traditional visit in the office.    As this is a virtual visit, video technology does not allow for your provider to perform a traditional examination.  This may limit your provider's ability to fully assess your condition.  If your provider identifies any concerns that need to be evaluated in person or the need to arrange testing (such as labs, EKG, etc.), we will make arrangements to do so.     Although advances in technology are sophisticated, we cannot ensure that it will always work on either your end or our end.  If the connection with a video visit is poor, the visit may have to be switched to a telephone visit.  With either a video or telephone visit, we are not always able to ensure that we have a secure connection.     I need to obtain your verbal consent now.   Are you willing to proceed with your visit today? YES   [name] has provided verbal consent on 11/18/2021 for a virtual visit (video or telephone).   Megan Hassell Done, FNP   Date: 11/18/2021 4:37 PM   Virtual Visit via Video Note   I, Megan Kennedy, connected with Megan Kennedy ([id number], [birth year]) on 11/18/21 at  4:45 PM EST by a video-enabled telemedicine application and verified that I am speaking with the correct person using two identifiers.  Location: Patient: Virtual Visit Location Patient: Home Provider: Virtual Visit Location Provider: Mobile   I discussed the  limitations of evaluation and management by telemedicine and the availability of in person appointments. The patient expressed understanding and agreed to proceed.    History of Present Illness: Megan Kennedy is a 46 y.o. who identifies as a female who was assigned female at birth, and is being seen today for bronchitis.  HPI: Patient states she went to urgent care on Sunday and was dx with bronchitis. They gave her azithromycin and prednisone taper pack. She still has a real bad cough. Mucinex DM is not helping. She actually thinks that her cough is worse.    ROS  Problems: There are no problems to display for this patient.   Allergies:  Allergies  Allergen Reactions   Reglan [Metoclopramide]    Medications:  Current Outpatient Medications:    azithromycin (ZITHROMAX Z-PAK) 250 MG tablet, Take two pills today followed by one a day until gone, Disp: 6 tablet, Rfl: 0   cetirizine (ZYRTEC) 10 MG tablet, Take 10 mg by mouth daily., Disp: , Rfl:    FLUoxetine (PROZAC) 20 MG capsule, Take 20 mg by mouth daily., Disp: , Rfl:    loratadine (CLARITIN) 10 MG tablet, 1 tablet, Disp: , Rfl:    Melatonin 3 MG CAPS, Take by mouth., Disp: , Rfl:    pravastatin (PRAVACHOL) 40 MG tablet, Take 40 mg by mouth daily., Disp: , Rfl:    predniSONE (DELTASONE) 20 MG tablet, Take 1 tablet (20  mg total) by mouth 2 (two) times daily with a meal., Disp: 10 tablet, Rfl: 0  Observations/Objective: Patient is well-developed, well-nourished in no acute distress.  Resting comfortably  at home.  Head is normocephalic, atraumatic.  No labored breathing.  Speech is clear and coherent with logical content.  Patient is alert and oriented at baseline.  Wet cough.- constant during visit  Assessment and Plan:  Megan Kennedy in today with chief complaint of Bronchitis   1. Bronchitis 1. Take meds as prescribed 2. Use a cool mist humidifier especially during the winter months and when heat has been  humid. 3. Use saline nose sprays frequently 4. Saline irrigations of the nose can be very helpful if Kennedy frequently.  * 4X daily for 1 week*  * Use of a nettie pot can be helpful with this. Follow directions with this* 5. Drink plenty of fluids 6. Keep thermostat turn down low 7.For any cough or congestion- promethazine DM 8. For fever or aces or pains- take tylenol or ibuprofen appropriate for age and weight.  * for fevers greater than 101 orally you may alternate ibuprofen and tylenol every  3 hours.   Meds ordered this encounter  Medications   albuterol (VENTOLIN HFA) 108 (90 Base) MCG/ACT inhaler    Sig: Inhale 2 puffs into the lungs every 6 (six) hours as needed for wheezing or shortness of breath.    Dispense:  8 g    Refill:  0    Order Specific Question:   Supervising Provider    Answer:   Sabra Heck, BRIAN [[id number]]   promethazine-dextromethorphan (PROMETHAZINE-DM) 6.25-15 MG/5ML syrup    Sig: Take 5 mLs by mouth 4 (four) times daily as needed for cough.    Dispense:  240 mL    Refill:  0    Order Specific Question:   Supervising Provider    Answer:   Noemi Chapel [[id number]]         Follow Up Instructions: I discussed the assessment and treatment plan with the patient. The patient was provided an opportunity to ask questions and all were answered. The patient agreed with the plan and demonstrated an understanding of the instructions.  A copy of instructions were sent to the patient via MyChart.  The patient was advised to call back or seek an in-person evaluation if the symptoms worsen or if the condition fails to improve as anticipated.  Time:  I spent 6 minutes with the patient via telehealth technology discussing the above problems/concerns.    Megan Hassell Done, FNP

## 2021-11-18 NOTE — Patient Instructions (Signed)

## 2021-11-25 ENCOUNTER — Emergency Department
Admission: RE | Admit: 2021-11-25 | Discharge: 2021-11-25 | Disposition: A | Discharge status: Home / Self Care | Payer: Commercial Managed Care - PPO | Source: Ambulatory Visit | Attending: Family Medicine | Admitting: Family Medicine

## 2021-11-25 ENCOUNTER — Other Ambulatory Visit: Payer: Self-pay

## 2021-11-25 ENCOUNTER — Emergency Department (INDEPENDENT_AMBULATORY_CARE_PROVIDER_SITE_OTHER): Payer: Commercial Managed Care - PPO

## 2021-11-25 VITALS — BP 134/84 | HR 101 | Temp 98.2°F | Resp 16 | Wt 260.0 lb

## 2021-11-25 DIAGNOSIS — R059 Cough, unspecified: Secondary | ICD-10-CM

## 2021-11-25 DIAGNOSIS — R058 Other specified cough: Secondary | ICD-10-CM | POA: Diagnosis not present

## 2021-11-25 MED ORDER — ATROVENT HFA 17 MCG/ACT IN AERS: 2.0000 | INHALATION_SPRAY | Freq: Four times a day (QID) | RESPIRATORY_TRACT | 12 refills | Status: DC | PRN

## 2021-11-25 NOTE — Discharge Instructions (Addendum)
Chest x-ray is normal.  No pneumonia You need to continue to drink lots of fluids.  Continue to run a humidifier, and do your sinus rinses. Use Atrovent as directed.  This is the inhaler that is best suited for postviral cough Continuing over-the-counter cough preparations like Mucinex DM or Delsym every 12 hours See your doctor if not improving in another week to 2.  Postviral cough can last 4 to 6 weeks

## 2021-11-25 NOTE — ED Triage Notes (Signed)
Pt presents with continued cough x3.5 weeks with SOB

## 2021-11-25 NOTE — ED Provider Notes (Signed)
[facility]    CSN: [id number] Arrival date & time: 11/25/21  1444      History   Chief Complaint Chief Complaint  Patient presents with   Cough    HPI Sharlisa Hollifield is a 46 y.o. female.   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  She was treated for bronchitis a week and a half ago.  At that point she been coughing for 2 weeks.  She was prescribed azithromycin, prednisone, and cough medicine.  She was told to rest and push fluids, take medicines as prescribed, return as needed cough medicine did not seem to work.  She was more short of breath.  She had a video visit shortly thereafter.  They prescribed a stronger cough medicine and albuterol.  In spite of using all the prescription and nonprescription medications to improve her symptoms, she continues to cough.  She is short of breath.  She states she has bad coughing spells where she coughs so hard that her bladder leaks.  She states that she does not have any fever or body aches, does not "feel bad".  Just has a persistent cough. Does not have underlying lung disease or asthma Is not a smoker  Past Medical History:  Diagnosis Date   Melanoma ([place])     There are no problems to display for this patient.   Past Surgical History:  Procedure Laterality Date   APPENDECTOMY     CESAREAN SECTION     CHOLECYSTECTOMY      OB History   No obstetric history on file.      Home Medications    Prior to Admission medications   Medication Sig Start Date End Date Taking? Authorizing Provider  ipratropium (ATROVENT HFA) 17 MCG/ACT inhaler Inhale 2 puffs into the lungs every 6 (six) hours as needed for wheezing. 11/25/21  Yes Raylene Everts, MD  albuterol (VENTOLIN HFA) 108 (90 Base) MCG/ACT inhaler Inhale 2 puffs into the lungs every 6 (six) hours as needed for wheezing or shortness of breath. 11/18/21   Hassell Done Mary-Margaret, FNP  cetirizine (ZYRTEC) 10 MG tablet Take 10 mg by mouth daily.    [provider]  FLUoxetine (PROZAC) 20 MG capsule Take 20 mg by mouth daily.    [provider]  loratadine (CLARITIN) 10 MG tablet 1 tablet    [provider]  Melatonin 3 MG CAPS Take by mouth.    [provider]  pravastatin (PRAVACHOL) 40 MG tablet Take 40 mg by mouth daily. 11/03/21   [provider]  promethazine-dextromethorphan (PROMETHAZINE-DM) 6.25-15 MG/5ML syrup Take 5 mLs by mouth 4 (four) times daily as needed for cough. 11/18/21   Chevis Pretty, FNP    Family History Family History  Problem Relation Age of Onset   Breast cancer Cousin     Social History Social History   Tobacco Use   Smoking status: Never  Substance Use Topics   Alcohol use: No   Drug use: No     Allergies   Reglan [metoclopramide]   Review of Systems Review of Systems See HPI  Physical Exam Triage Vital Signs ED Triage Vitals  Enc Vitals Group     BP 11/25/21 1452 134/84     Pulse Rate 11/25/21 1452 (!) 101     Resp 11/25/21 1452 16     Temp 11/25/21 1452 98.2 F (36.8 C)     Temp Source 11/25/21 1452 Oral     SpO2 11/25/21 1452 98 %  Weight 11/25/21 1453 260 lb (117.9 kg)     Height --      Head Circumference --      Peak Flow --      Pain Score 11/25/21 1453 2     Pain Loc --      Pain Edu? --      Excl. in [place]? --    No data found.  Updated Vital Signs BP 134/84 (BP Location: Right Arm)    Pulse (!) 101    Temp 98.2 F (36.8 C) (Oral)    Resp 16    Wt 117.9 kg    LMP 11/16/2021 (Exact Date)    SpO2 98%    BMI 43.27 kg/m       Physical Exam Constitutional:      General: She is not in acute distress.    Appearance: She is well-developed. She is obese.     Comments: Pleasant.  Slightly short of breath with conversation  HENT:     Head: Normocephalic and atraumatic.     Right Ear: Tympanic membrane and ear canal normal.     Left Ear: Tympanic membrane and ear canal normal.     Nose: No congestion or rhinorrhea.     Mouth/Throat:      Mouth: Mucous membranes are moist.     Pharynx: No posterior oropharyngeal erythema.  Eyes:     Conjunctiva/sclera: Conjunctivae normal.     Pupils: Pupils are equal, round, and reactive to light.  Cardiovascular:     Rate and Rhythm: Normal rate.     Heart sounds: Normal heart sounds.  Pulmonary:     Effort: Pulmonary effort is normal. No respiratory distress.     Breath sounds: Wheezing present.  Abdominal:     General: There is no distension.     Palpations: Abdomen is soft.  Musculoskeletal:        General: Normal range of motion.     Cervical back: Normal range of motion.  Skin:    General: Skin is warm and dry.  Neurological:     Mental Status: She is alert.  Psychiatric:        Mood and Affect: Mood normal.        Behavior: Behavior normal.     UC Treatments / Results  Labs (all labs ordered are listed, but only abnormal results are displayed) Labs Reviewed - No data to display  EKG   Radiology DG Chest 2 View  Result Date: 11/25/2021 CLINICAL DATA:  Cough EXAM: CHEST - 2 VIEW COMPARISON:  None. FINDINGS: The heart size and mediastinal contours are within normal limits. Both lungs are clear. The visualized skeletal structures are unremarkable. IMPRESSION: No active cardiopulmonary disease. Electronically Signed   By: Donavan Foil M.D.   On: 11/25/2021 15:31    Procedures Procedures (including critical care time)  Medications Ordered in UC Medications - No data to display  Initial Impression / Assessment and Plan / UC Course  I have reviewed the triage vital signs and the nursing notes.  Pertinent labs & imaging results that were available during my care of the patient were reviewed by me and considered in my medical decision making (see chart for details).     Final Clinical Impressions(s) / UC Diagnoses   Final diagnoses:  Post-viral cough syndrome     Discharge Instructions      Chest x-ray is normal.  No pneumonia You need to continue to  drink lots of fluids.  Continue  to run a humidifier, and do your sinus rinses. Use Atrovent as directed.  This is the inhaler that is best suited for postviral cough Continuing over-the-counter cough preparations like Mucinex DM or Delsym every 12 hours See your doctor if not improving in another week to 2.  Postviral cough can last 4 to 6 weeks     ED Prescriptions     Medication Sig Dispense Auth. Provider   ipratropium (ATROVENT HFA) 17 MCG/ACT inhaler Inhale 2 puffs into the lungs every 6 (six) hours as needed for wheezing. 1 each Raylene Everts, MD      PDMP not reviewed this encounter.   Raylene Everts, MD 11/25/21 1538

## 2022-05-22 ENCOUNTER — Other Ambulatory Visit: Payer: Self-pay | Admitting: Family Medicine

## 2022-05-22 DIAGNOSIS — Z1231 Encounter for screening mammogram for malignant neoplasm of breast: Secondary | ICD-10-CM

## 2022-06-11 DIAGNOSIS — Z1231 Encounter for screening mammogram for malignant neoplasm of breast: Secondary | ICD-10-CM

## 2022-06-25 ENCOUNTER — Ambulatory Visit (INDEPENDENT_AMBULATORY_CARE_PROVIDER_SITE_OTHER): Payer: Managed Care, Other (non HMO)

## 2022-06-25 DIAGNOSIS — Z1231 Encounter for screening mammogram for malignant neoplasm of breast: Secondary | ICD-10-CM

## 2023-06-28 ENCOUNTER — Other Ambulatory Visit: Payer: Self-pay | Admitting: Family Medicine

## 2023-06-28 DIAGNOSIS — Z1231 Encounter for screening mammogram for malignant neoplasm of breast: Secondary | ICD-10-CM

## 2023-06-30 DIAGNOSIS — Z1231 Encounter for screening mammogram for malignant neoplasm of breast: Secondary | ICD-10-CM

## 2023-07-08 ENCOUNTER — Ambulatory Visit: Payer: Managed Care, Other (non HMO)

## 2023-07-14 ENCOUNTER — Ambulatory Visit: Payer: Managed Care, Other (non HMO)

## 2023-07-14 DIAGNOSIS — Z1231 Encounter for screening mammogram for malignant neoplasm of breast: Secondary | ICD-10-CM

## 2023-07-20 ENCOUNTER — Other Ambulatory Visit: Payer: Managed Care, Other (non HMO)

## 2023-07-20 ENCOUNTER — Other Ambulatory Visit: Payer: Self-pay | Admitting: Family Medicine

## 2023-07-20 DIAGNOSIS — R928 Other abnormal and inconclusive findings on diagnostic imaging of breast: Secondary | ICD-10-CM

## 2023-07-23 ENCOUNTER — Ambulatory Visit
Admission: RE | Admit: 2023-07-23 | Discharge: 2023-07-23 | Disposition: A | Discharge status: Home / Self Care | Payer: Managed Care, Other (non HMO) | Source: Ambulatory Visit | Attending: Family Medicine | Admitting: Family Medicine

## 2023-07-23 DIAGNOSIS — R928 Other abnormal and inconclusive findings on diagnostic imaging of breast: Secondary | ICD-10-CM

## 2023-07-30 ENCOUNTER — Other Ambulatory Visit: Payer: Managed Care, Other (non HMO)

## 2023-11-24 ENCOUNTER — Telehealth: Payer: Self-pay | Admitting: Genetic Counselor

## 2023-11-24 NOTE — Telephone Encounter (Signed)
Scheduled appointments per 2/12 scheduling message. Patient is aware of the scheduled appointments and is active on MyChart.

## 2024-01-11 ENCOUNTER — Other Ambulatory Visit: Payer: Self-pay | Admitting: Genetic Counselor

## 2024-01-11 ENCOUNTER — Encounter: Payer: Self-pay | Admitting: Genetic Counselor

## 2024-01-11 ENCOUNTER — Inpatient Hospital Stay: Payer: Managed Care, Other (non HMO) | Attending: Genetic Counselor

## 2024-01-11 ENCOUNTER — Inpatient Hospital Stay: Payer: Managed Care, Other (non HMO) | Admitting: Genetic Counselor

## 2024-01-11 DIAGNOSIS — Z808 Family history of malignant neoplasm of other organs or systems: Secondary | ICD-10-CM

## 2024-01-11 DIAGNOSIS — Z800 Family history of malignant neoplasm of digestive organs: Secondary | ICD-10-CM | POA: Diagnosis not present

## 2024-01-11 DIAGNOSIS — C4362 Malignant melanoma of left upper limb, including shoulder: Secondary | ICD-10-CM

## 2024-01-11 DIAGNOSIS — Z803 Family history of malignant neoplasm of breast: Secondary | ICD-10-CM

## 2024-01-11 DIAGNOSIS — C439 Malignant melanoma of skin, unspecified: Secondary | ICD-10-CM | POA: Insufficient documentation

## 2024-01-11 LAB — GENETIC SCREENING ORDER

## 2024-01-11 NOTE — Progress Notes (Signed)
 REFERRING PROVIDER: Jarrett Soho, PA-C [address] [place],  [place] [address]  PRIMARY PROVIDER:  Jarrett Soho, PA-C  PRIMARY REASON FOR VISIT:  1. Family history of breast cancer   2. Family history of melanoma   3. Malignant melanoma of left upper extremity including shoulder (HCC)   4. Family history of pancreatic cancer      HISTORY OF PRESENT ILLNESS:   Megan Kennedy, a 48 y.o. female, was seen for a [facility] cancer genetics consultation at the request of Jarrett Soho, PA-C due to a personal and family history of cancer.  [name] presents to clinic today to discuss the possibility of a hereditary predisposition to cancer, genetic testing, and to further clarify her future cancer risks, as well as potential cancer risks for family members.   At the age of 65 and 48, [name] was diagnosed with melanoma of the upper left arm and her back. She also reports a personal history of pancreatitis.  The treatment plan included removal and no further treatment was needed.Marland Kitchen Her father recently underwent genetic testing at the [place] and was found to have a pathogenic mutation in ATM.   CANCER HISTORY:  Oncology History   No history exists.     RISK FACTORS:  Menarche was at age 29.  First live birth at age 45.  OCP use for approximately  15  years.  Ovaries intact: yes.  Hysterectomy: no.  Menopausal status: premenopausal.  HRT use: 0 years. Colonoscopy: no;  neg cologuard . Mammogram within the last year: yes. Number of breast biopsies: 0. Up to date with pelvic exams: yes. Any excessive radiation exposure in the past: no  Past Medical History:  Diagnosis Date   Family history of breast cancer    Family history of melanoma    Family history of melanoma    Family history of pancreatic cancer    Melanoma (HCC)    Melanoma (HCC)     Past Surgical History:  Procedure Laterality Date   APPENDECTOMY     CESAREAN SECTION     CHOLECYSTECTOMY       Social History   Socioeconomic History   Marital status: Married    Spouse name: Not on file   Number of children: Not on file   Years of education: Not on file   Highest education level: Not on file  Occupational History   Not on file  Tobacco Use   Smoking status: Never   Smokeless tobacco: Not on file  Substance and Sexual Activity   Alcohol use: No   Drug use: No   Sexual activity: Not on file  Other Topics Concern   Not on file  Social History Narrative   Not on file   Social Drivers of Health   Financial Resource Strain: Not on file  Food Insecurity: Not on file  Transportation Needs: Not on file  Physical Activity: Not on file  Stress: Not on file  Social Connections: Not on file     FAMILY HISTORY:  We obtained a detailed, 4-generation family history.  Significant diagnoses are listed below: Family History  Problem Relation Age of Onset   Lung cancer Father 54       ATM pathogenic mutation   Pancreatic cancer Paternal Aunt 56   Melanoma Paternal Grandfather 36   Breast cancer Cousin 54       maternal first cousin     The patient has two sons who are cancer free.  She is  an only child.  Her parents are living.  The patient's father had lung cancer and recently had genetic testing and found a pathogenic mutation in ATM.  He had one brother and three sisters.  One sister died of pancreatic cancer and a sister has lung cancer. The paternal grandfather had melanoma at 73.  The patient's mother is cancer free.  She has two brothers and two sisters who did not have cancer.  Her sister has a daughter with bilateral breast cancer at 46.  Megan Kennedy is aware of previous family history of genetic testing for hereditary cancer risks.   GENETIC COUNSELING ASSESSMENT: Megan Kennedy is a 48 y.o. female with a personal and family history of cancer which is somewhat suggestive of a hereditary cancer syndrome and predisposition to cancer given the combination of  melanoma and pancreatic cancer and the known ATM mutation. We, therefore, discussed and recommended the following at today's visit.   DISCUSSION: We discussed that, in general, most cancer is not inherited in families, but instead is sporadic or familial. Sporadic cancers occur by chance and typically happen at older ages (>50 years) as this type of cancer is caused by genetic changes acquired during an individual's lifetime. Some families have more cancers than would be expected by chance; however, the ages or types of cancer are not consistent with a known genetic mutation or known genetic mutations have been ruled out. This type of familial cancer is thought to be due to a combination of multiple genetic, environmental, hormonal, and lifestyle factors. While this combination of factors likely increases the risk of cancer, the exact source of this risk is not currently identifiable or testable.  We discussed that 5 - 10% of melanoma cases are hereditary, with most cases associated with CDKN2A.  There are other genes that can be associated with hereditary melanoma cancer syndromes.  These include BAP1, MITF and POT1. Her father was recently diagnosed with a pathogenic ATM mutation.  Melanoma is not typically associated with ATM mutations.  We reviewed the cancers typically associated with ATM mutations including breast and pancreatic cancer.  We also discussed an associated increased risk for ovarian and colon cancer, however there are no guidelines suggesting specific follow up for this.  [name] is at 50% risk to have inherited this ATM mutation from her father. We discussed that testing is beneficial for several reasons including knowing how to follow individuals and understand if other family members could be at risk for cancer and allow them to undergo genetic testing.   We reviewed the characteristics, features and inheritance patterns of hereditary cancer syndromes. We also discussed genetic  testing, including the appropriate family members to test, the process of testing, insurance coverage and turn-around-time for results. We discussed the implications of a negative, positive, carrier and/or variant of uncertain significant result. Ms. Cid  was offered a common hereditary cancer panel (36+ genes) and an expanded pan-cancer panel (70+ genes). Ms. Reis was informed of the benefits and limitations of each panel, including that expanded pan-cancer panels contain genes that do not have clear management guidelines at this point in time.  We also discussed that as the number of genes included on a panel increases, the chances of variants of uncertain significance increases. Ms. Provencal decided to pursue genetic testing for the CancerNext-Expanded+RNAinsight gene panel.   The CancerNext-Expanded gene panel offered by [facility] and includes sequencing, rearrangement, and RNA analysis for the following 90 genes: AIP, ALK, APC, ATM, ATRIP, AXIN2, BAP1,  BARD1, BMPR1A, BRCA1, BRCA2, BRIP1, CDC73, CDH1, CDK4, CDKN1B, CDKN2A, CEBPA, CFTR, CHEK2, CPA1, CTNNA1, CTRC, DDX41, DICER1, EGLN1, ETV6, FH, FLCN, GATA2, KIF1B, LZTR1, MAX, MBD4, MEN1, MET, MLH1, MLH3, MSH2, MSH3, MSH6, MUTYH, NF1, NF2, NTHL1, PALB2, PALLD, PHOX2B, PMS2, POT1, PRKAR1A, PRSS1, PTCH1, PTEN, RAD51B, RAD51C, RAD51D, RB1, RET, RNF43, RPS20, RUNX1, SDHA, SDHAF2, SDHB, SDHC, SDHD, SMAD4, SMARCA4, SMARCB1, SMARCE1, SPINK1, STK11, SUFU, TERT, TMEM127, TP53, TSC1, TSC2, VHL, and WT1 (sequencing and deletion/duplication); EGFR, HOXB13, KIT, MITF, PDGFRA, POLD1, and POLE (sequencing only); EPCAM and GREM1 (deletion/duplication only).    Based on Ms. Negro's personal and family history of cancer, she meets medical criteria for genetic testing. Despite that she meets criteria, she may still have an out of pocket cost. We discussed that if her out of pocket cost for testing is over $100, the laboratory will call and confirm whether  she wants to proceed with testing.  If the out of pocket cost of testing is less than $100 she will be billed by the genetic testing laboratory.   We discussed that some people do not want to undergo genetic testing due to fear of genetic discrimination.  The Genetic Information Nondiscrimination Act (GINA) was signed into federal law in 2008. GINA prohibits health insurers and most employers from discriminating against individuals based on genetic information (including the results of genetic tests and family history information). According to GINA, health insurance companies cannot consider genetic information to be a preexisting condition, nor can they use it to make decisions regarding coverage or rates. GINA also makes it illegal for most employers to use genetic information in making decisions about hiring, firing, promotion, or terms of employment. It is important to note that GINA does not offer protections for life insurance, disability insurance, or long-term care insurance. GINA does not apply to those in the [employer], those who work for companies with less than 15 employees, and new life insurance or long-term disability insurance policies.  Health status due to a cancer diagnosis is not protected under GINA. More information about GINA can be found by visiting [email or web address].  Based on the patient's personal and family history, a statistical model Charity fundraiser and [employer]) was used to estimate her risk of developing breast cancer. This estimates her lifetime risk of developing breast cancer to be approximately 11-16.7%. This estimation does not consider any genetic testing results.  The patient's lifetime breast cancer risk is a preliminary estimate based on available information using one of several models endorsed by the American Cancer Society (ACS). The ACS recommends consideration of breast MRI screening as an adjunct to mammography for patients at high risk (defined as 20% or greater  lifetime risk). Please note that a woman's breast cancer risk changes over time. It may increase or decrease based on age and any changes to the personal and/or family medical history. The risks and recommendations listed above apply to this patient at this point in time. In the future, she may or may not be eligible for the same medical management strategies and, in some cases, other medical management strategies may become available to her. If she is interested in an updated breast cancer risk assessment at a later date, she can contact us.  Ms. Azure has NOT been determined to be at high risk for breast cancer based on her family history at this time.  Should she inherit the ATM mutation from her father, she would be at increased risk and medical management changes would need to be considered.  PLAN: After considering the risks, benefits, and limitations, Ms. Hobbins provided informed consent to pursue genetic testing and the blood sample was sent to [facility] for analysis of the CancerNext-Expanded+RNAinsight. Results should be available within approximately 2-3 weeks' time, at which point they will be disclosed by telephone to Ms. Navarrette, as will any additional recommendations warranted by these results. Ms. Wilbourn will receive a summary of her genetic counseling visit and a copy of her results once available. This information will also be available in Epic.   Lastly, we encouraged Ms. Mones to remain in contact with cancer genetics annually so that we can continuously update the family history and inform her of any changes in cancer genetics and testing that may be of benefit for this family.   Ms. Heiney questions were answered to her satisfaction today. Our contact information was provided should additional questions or concerns arise. Thank you for the referral and allowing [place] to share in the care of your patient.   Alma Muegge P. Lowell Guitar, MS, CGC Licensed,  Patent attorney [email or web address] phone: [phone]  80 minutes were spent on the date of the encounter in service to the patient including preparation, face-to-face consultation, documentation and care coordination.  The patient was seen alone.  Drs. Meliton Rattan, and/or [place] were available for questions, if needed..    _______________________________________________________________________ For Office Staff:  Number of people involved in session: 1 Was an Intern/ student involved with case: no

## 2024-02-03 ENCOUNTER — Telehealth: Payer: Self-pay

## 2024-02-03 ENCOUNTER — Inpatient Hospital Stay: Admitting: Genetic Counselor

## 2024-02-03 ENCOUNTER — Encounter: Payer: Self-pay | Admitting: Gastroenterology

## 2024-02-03 DIAGNOSIS — Z1501 Genetic susceptibility to malignant neoplasm of breast: Secondary | ICD-10-CM | POA: Diagnosis not present

## 2024-02-03 DIAGNOSIS — Z800 Family history of malignant neoplasm of digestive organs: Secondary | ICD-10-CM

## 2024-02-03 DIAGNOSIS — Z1509 Genetic susceptibility to other malignant neoplasm: Secondary | ICD-10-CM

## 2024-02-03 NOTE — Telephone Encounter (Signed)
-----   Message from Annis Kinder sent at 02/03/2024  1:17 PM EDT ----- Regarding: RE: Pancreatic cancer screening No problem, happy to get her into the office to discuss.  Peterson Brandt, can schedule with me or GM for office visit to evaluate for Pancreatic Cancer screening and Colon cancer screening discussion.  Thanks.  VC ----- Message ----- From: Jesusa Moros, Counselor Sent: 02/03/2024   9:55 AM EDT To: Normie Becton., MD; # Subject: Pancreatic cancer screening                    Good morning!  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  She is also interested in discussing whether she should have a colonoscopy or if cologuard screening is okay.  NCCN reports a 5-10% risk for colon cancer, but does not make any screening recommendations based on this risk.    Can you all get her scheduled?  Thank you,  KPP

## 2024-02-03 NOTE — Telephone Encounter (Signed)
 Patient called back and was scheduled for an OV tomorrow at 3:00 PM. Please advise.

## 2024-02-03 NOTE — Progress Notes (Signed)
 GENETIC TEST RESULTS   Patient Name: Megan Kennedy Patient Age: 48 y.o. Encounter Date: 02/03/2024  Referring Provider: Darnelle Elders, PA-C    Megan Kennedy was seen in the Cancer Genetics clinic on February 03, 2024 due to a family history of cancer and concern regarding a hereditary predisposition to cancer in the family. Please refer to the prior Genetics clinic note for more information regarding Megan Kennedy medical and family histories and our assessment at the time.   FAMILY HISTORY:  We obtained a detailed, 4-generation family history.  Significant diagnoses are listed below: Family History  Problem Relation Age of Onset   Lung cancer Father 62       ATM pathogenic mutation   Pancreatic cancer Paternal Aunt 67   Melanoma Paternal Grandfather 25   Breast cancer Cousin 19       maternal first cousin       The patient has two sons who are cancer free.  She is an only child.  Her parents are living.   The patient's father had lung cancer and recently had genetic testing and found a pathogenic mutation in ATM.  He had one brother and three sisters.  One sister died of pancreatic cancer and a sister has lung cancer. She reportedly had negative genetic testing, but we do not have this report. The paternal grandfather had melanoma at 62.   The patient's mother is cancer free.  She has two brothers and two sisters who did not have cancer.  Her sister has a daughter with bilateral breast cancer at 83.   Megan Kennedy is aware of previous family history of genetic testing for hereditary cancer risks.  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     Clinical Information: Hereditary breast due to pathogenic variants in ATM is characterized by an increased lifetime  risk for, generally, adult-onset cancers including, breast, contralateral breast, ovarian, prostate, pancreatic and possibly colon cancer.  The cancers associated with ATM are:  Female breast cancer, up to a 24% risk In women with a history of breast cancer, the cumulative risk for contralateral breast cancer 10 years after breast cancer diagnosis is 4%.  Ovarian cancer, up to a 3% risk Pancreatic cancer, 5-10% risk Prostate cancer, elevated risk Colon cancer, up to a 10% risk    Management Recommendations:  Management for individuals with ATM mutations can be found in the NCCN guidelines (v1.2025).  These guidelines recommend the following:  Breast Cancer    Absolute risk: 21-24% Screening: Annual mammogram with consideration of tomosynthesis at age 64 and consider breast MRI with contrast starting at age 33 - 38 years. Risk Reducing Mastectomy: Evidence insufficient, consider based on family history Contralateral 10 year breast cancer risk: 4%  Ovarian Cancer  Absolute risk: 2-3% Evidence is insufficient for risk-reducing salpingo-oophorectomy (RRSO).  This risk should be managed by family history.  Pancreatic Cancer   Absolute risk: ~5-10% Consider screening starting at age 48 years (or 10 years younger than the earliest exocrine pancreatic cancer diagnosis in the family, whichever is earlier) for individuals with exocrine pancreatic cancer in >=1 first- or second-degree relatives from the same side of (or presumed to be from the same side of) the family as the identified P/LP germline variant  Prostate Cancer  Emerging evidence for association with increased risk. Consider prostate cancer screening starting at age  40.  Colon Cancer  Estimated Absolute risk: ~5-10% Evidence insufficient to provide specialized CRC screening recommendations, manage based on family history  FAMILY MEMBERS: Hereditary predisposition to cancer due to pathogenic variants in the ATM gene has  autosomal dominant inheritance. This means that an individual with a pathogenic variant has a 50% chance of passing the condition on to their offspring. Once a pathogenic mutation is detected in an individual, it is possible to identify at-risk relatives who can pursue testing for this specific familial variant. Many cases are inherited from a parent, but some cases may occur spontaneously (i.e., an individual with a pathogenic variant who has parents who do not have it).  Individuals with a single pathogenic ATM variant are also carriers of autosomal recessive Ataxia Telangiectasia. Ataxia Telangiectasia is characterized by childhood onset of progressive neurologic manifestations, immunodeficiency, pulmonary disease,  and increased risk for cancer. For there to be a risk of Ataxia Telangiectasia in offspring, both the patient and their partner would each have to carry a pathogenic variant in ATM; in this case, the risk to have an affected child is 25%.It is important that all of Megan Kennedy relatives (both men and women) know of the presence of this gene mutation. Site-specific genetic testing can sort out who in the family is at risk and who is not. It is important that all of Megan Kennedy's relatives (both men and women) know of the presence of this gene mutation. Site-specific genetic testing can sort out who in the family is at risk and who is not.   Megan Kennedy children have a 50% chance to have inherited this mutation. We recommend they have genetic testing for this same mutation, as identifying the presence of this mutation would allow them to also take advantage of risk-reducing measures.   SUPPORT AND RESOURCES: If Megan Kennedy is interested in ATM-specific information and support, there are two groups, Facing Our Risk (www.facingourrisk.com) and Bright Pink (www.brightpink.org) which some people have found useful. They provide opportunities to speak with other individuals from high-risk  families. To locate genetic counselors in other cities, visit the website of the [employer] ([employer]) and Financial controller for a Veterinary surgeon by zip code.  PLAN: We will make a referral to the High Risk Breast Clinic at [facility]. We will make a referral to discuss pancreatic cancer screening at [facility].  Megan Kennedy will discuss at that time their preference for colonoscopy vs cologuard due to the ATM cancer risks. There are not currently any colon cancer screening guidelines based on the colon cancer risks for ATM Megan Kennedy will discuss with her GYN the option of removal of fallopian tubes, or possibly her ovaries due to having ovarian cysts, to help mitigate any ovarian cancer risks. There are not currently any risk reducing options recommended for ovarian cancer risks due to ATM.  We encouraged Megan Kennedy to remain in contact with us  on an annual basis so we can update her personal and family histories, and let her know of advances in cancer genetics that may benefit the family. Our contact number was provided. Megan Kennedy questions were answered to her satisfaction today, and she knows she is welcome to call anytime with additional questions.   Alveria Mcglaughlin P. [place], MS, [facility] Licensed, Patent attorney [email or web address] phone: [phone]  60 minutes were spent on the date of the encounter in service to the patient including preparation, face-to-face consultation, documentation and care coordination.

## 2024-02-03 NOTE — Telephone Encounter (Signed)
 Left message for patient to return call to schedule new patient appointment per Dr Karene Oto.  Will continue efforts.

## 2024-02-04 ENCOUNTER — Ambulatory Visit: Admitting: Gastroenterology

## 2024-02-04 ENCOUNTER — Encounter: Payer: Self-pay | Admitting: Gastroenterology

## 2024-02-04 VITALS — BP 122/76 | HR 77 | Ht 65.0 in | Wt 272.0 lb

## 2024-02-04 DIAGNOSIS — R7303 Prediabetes: Secondary | ICD-10-CM | POA: Diagnosis not present

## 2024-02-04 DIAGNOSIS — Z1509 Genetic susceptibility to other malignant neoplasm: Secondary | ICD-10-CM | POA: Diagnosis not present

## 2024-02-04 DIAGNOSIS — Z1501 Genetic susceptibility to malignant neoplasm of breast: Secondary | ICD-10-CM

## 2024-02-04 DIAGNOSIS — Z800 Family history of malignant neoplasm of digestive organs: Secondary | ICD-10-CM | POA: Diagnosis not present

## 2024-02-04 DIAGNOSIS — Z1211 Encounter for screening for malignant neoplasm of colon: Secondary | ICD-10-CM

## 2024-02-04 NOTE — Patient Instructions (Signed)
 You have been scheduled for an MRI at [facility] on 02/11/24. Your appointment time is 8:15 am. Please arrive to admitting (at main entrance of the hospital) 30 minutes prior to your appointment time for registration purposes. Please make certain not to have anything to eat or drink after midnight. In addition, if you have any metal in your body, have a pacemaker or defibrillator, please be sure to let your ordering physician know. This test typically takes 45 minutes to 1 hour to complete. Should you need to reschedule, please call [phone] to do so.   _______________________________________________________  If your blood pressure at your visit was 140/90 or greater, please contact your primary care physician to follow up on this.  _______________________________________________________  If you are age 54 or older, your body mass index should be between 23-30. Your Body mass index is 45.26 kg/m. If this is out of the aforementioned range listed, please consider follow up with your Primary Care Provider.  If you are age 93 or younger, your body mass index should be between 19-25. Your Body mass index is 45.26 kg/m. If this is out of the aformentioned range listed, please consider follow up with your Primary Care Provider.   ________________________________________________________  The [facility] providers would like to encourage you to use MYCHART to communicate with providers for non-urgent requests or questions.  Due to long hold times on the telephone, sending your provider a message by [facility] may be a faster and more efficient way to get a response.  Please allow 48 business hours for a response.  Please remember that this is for non-urgent requests.  _______________________________________________________

## 2024-02-04 NOTE — Progress Notes (Signed)
 Chief Complaint: Pancreatic cancer screening, ATM gene mutation, colon cancer screening   Referring Provider:     Marijo Shove [facility])    HPI:     Megan Kennedy is a 48 y.o. female with a history of Melanoma x2, referred to the Gastroenterology Clinic for evaluation of Pancreatic Cancer screening due to recently diagnosed ATM gene mutation and family history.    She did have pancreatitis in 2006, presumed 2/2 gallstones and subsequently with ccy and no recurrence of pancreatitis. No prior EGD or colonoscopy. Did have Cologuard in fall 2024 and normal/negative. She is otherwise without hematochezia, melena.   She was seen in the [facility] yesterday.  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   Family history notable for the following: - Father: Lung cancer age 63, ATM pathogenic mutation - Paternal aunt: Pancreatic Cancer age 47 (no genetic testing) - Paternal aunt: Lung cancer (negative genetic testing) - Paternal grandfather: Melanoma, deceased age 48 - Maternal cousin: Breast cancer age 68 - No siblings.  2 sons who are cancer free   Reviewed additional labs from 12/30/2023: Normal CBC.  Transferrin sat 13%, otherwise normal iron panel.  No recent abdominal imaging for review. She had recent labs done with PCM and reports elevated Hgb A1c c/w prediabetes.  She will send me those results for review.   Past Medical History:  Diagnosis Date   Family history of breast cancer    Family history of melanoma    Family history of melanoma    Family history of pancreatic cancer    Melanoma (HCC)    Melanoma (HCC)      Past Surgical History:  Procedure Laterality Date   APPENDECTOMY     CESAREAN SECTION     CHOLECYSTECTOMY     Family History  Problem Relation Age of Onset   Lung cancer Father 30       ATM pathogenic mutation   Melanoma Paternal Grandfather 24    Pancreatic cancer Paternal Aunt 37   Breast cancer Cousin 21       maternal first cousin   Liver disease Neg Hx    Colon cancer Neg Hx    Esophageal cancer Neg Hx    Social History   Tobacco Use   Smoking status: Never  Vaping Use   Vaping status: Never Used  Substance Use Topics   Alcohol use: Yes    Comment: occ   Drug use: No   Current Outpatient Medications  Medication Sig Dispense Refill   busPIRone (BUSPAR) 5 MG tablet Take by mouth.     cetirizine (ZYRTEC) 10 MG tablet Take 10 mg by mouth daily.     FLUoxetine (PROZAC) 20 MG capsule Take 20 mg by mouth daily.     pravastatin (PRAVACHOL) 40 MG tablet Take 40 mg by mouth daily.     loratadine (CLARITIN) 10 MG tablet 1 tablet (Patient not taking: Reported on 02/04/2024)     Melatonin 3 MG CAPS Take by mouth. (Patient not taking: Reported on 02/04/2024)     No current facility-administered medications for this visit.   Allergies  Allergen Reactions   Reglan [Metoclopramide]      Review of Systems: All systems reviewed and negative except where noted in HPI.     Physical Exam:    Wt Readings from Last 3 Encounters:  02/04/24 272 lb (123.4 kg)  11/25/21  260 lb (117.9 kg)  11/06/14 245 lb (111.1 kg)    Ht 5\' 5"  (1.651 m)   Wt 272 lb (123.4 kg)   BMI 45.26 kg/m  Constitutional:  Pleasant, in no acute distress. Psychiatric: Normal mood and affect. Behavior is normal. Cardiovascular: Normal rate, regular rhythm. No edema Pulmonary/chest: Effort normal and breath sounds normal. No wheezing, rales or rhonchi. Abdominal: Soft, nondistended, nontender. Bowel sounds active throughout. There are no masses palpable. No hepatomegaly. Neurological: Alert and oriented to person place and time. Skin: Skin is warm and dry. No rashes noted.   ASSESSMENT AND PLAN;   1) Colon cancer screening Discussed CRC screening in light of her recent ATM gene mutation diagnosis.  She had a negative Cologuard in 2024.  We discussed the  role/utility of optical colonoscopy at this juncture.  Given her negative Cologuard study, lack of GI symptoms, and no family history of colon cancer, she prefers to hold off on optical colonoscopy for another 2.5 years when she would be due for repeat screening.    2) Family History of Pancreatic Cancer 3) ATM gene mutation We had an in-depth discussion regarding the most recent International CAPS Consortium, ASGE, and AGA guidelines re: screening for pancreatic cancer in select high risk individuals.  This patient meets the high risk screening cohort based on the following: ->=1 first-degree relatives with pancreatic cancer with Lynch Syndrome (more recent guidelines also include second-degree relative for Lynch Syndrome or ATM gene mutation -Carriers of a germline BRCA2, BRCA1, p16, PALB2, ATM, MLH1, MSH2, or MSH6 (ie, HNPCC) gene mutation with at least one affected first-degree relative  She has a first-degree relative (father) with the same ATM gene mutation, who in turn has a first-degree relative (his sister) with Pancreatic Cancer.  Given the evolving data which now includes patients with ATM gene mutation and second-degree relative with Pancreatic Cancer, I believe this patient meets criteria for Pancreatic Cancer screening protocol.  Recommended age to start surveillance varies by gene mutation status and family history, as follows: -ATM, MLH1/MSH2-start at age 13, or 73 years younger than the youngest affected blood relative  Screening techniques include the following: -Baseline: MRI/MRCP plus EUS plus fasting blood glucose and/or hemoglobin A1c  -Follow-up/surveillance phase: Alternate MRI/MRCP and EUS plus routine fasting blood glucose and/or hemoglobin A1c -If concerning features on imaging, check CA 19-9 -EUS with FNA for solid lesions >=5 mm, cystic lesions with worrisome features, or asymptomatic main pancreatic duct (MPD) strictures (with or without mass) -CT only for solid  lesions, regardless of size, or asymptomatic MPD strictures of unknown etiology (without mass)  -Screening interval every 12 months in patients with no abnormalities/nonconcerning abnormalities -Screening interval every 3-6 months in patients with abnormalities that are NOT suspicious for malignancy, but are concerning -EUS evaluation should be performed within 3-6 months for indeterminate lesions (abnormalities that are NOT suspicious for malignancy, but are concerning) -EUS evaluation should be performed within 3 months for high-risk lesions, if surgical resection is not planned. -Immediate surgical referral for abnormality suspicious for malignancy  -New-onset diabetes in a high-risk individual should lead to additional diagnostic studies or change in surveillance interval  -Genetic testing and counseling should be considered for familial pancreas cancer relatives who are eligible for surveillance. A positive germline mutation is associated with an increased risk of neoplastic progression and may also lead to screening for other relevant associated cancers -Participation in a registry or referral to a pancreas Center of Excellence should be pursued when possible for high-risk  patients undergoing pancreas cancer screening -The target detectable pancreatic neoplasms are resectable stage I pancreatic ductal adenocarcinoma and high-risk precursor neoplasms, such as intraductal papillary mucinous neoplasms with high-grade dysplasia and some enlarged pancreatic intraepithelial neoplasias  -We discussed the limitations and potential risks of pancreas cancer screening prior to initiating any screening program, and the patient  wishes to proceed with screening. Plan for the following:  - MRI/MRCP now  - Hgb A1c.  She reports this was recently done by her PCP and will send the result to me - EUS in 6 months - RTC every 6 months - If index MRI/MRCP and EUS are unrevealing/normal, per protocol, will  liberalize to annual screening, alternating between MRI/MRCP and EUS  I spent 60 minutes of time, including in depth chart review, independent review of results as outlined above, communicating results with the patient directly, face-to-face time with the patient, coordinating care, ordering studies and medications as appropriate, and documentation.     Annis Kinder, DO, FACG  02/04/2024, 3:20 PM   Darnelle Elders, PA-C

## 2024-02-07 NOTE — Addendum Note (Signed)
 Addended by: Deaisha Welborn B on: 02/07/2024 09:54 AM   Modules accepted: Orders

## 2024-02-11 ENCOUNTER — Ambulatory Visit (HOSPITAL_COMMUNITY)

## 2024-02-17 ENCOUNTER — Other Ambulatory Visit

## 2024-05-17 ENCOUNTER — Encounter: Payer: Self-pay | Admitting: Gastroenterology

## 2024-05-17 DIAGNOSIS — Z800 Family history of malignant neoplasm of digestive organs: Secondary | ICD-10-CM

## 2024-05-17 DIAGNOSIS — Z1501 Genetic susceptibility to malignant neoplasm of breast: Secondary | ICD-10-CM

## 2024-05-17 DIAGNOSIS — Z1509 Genetic susceptibility to other malignant neoplasm: Secondary | ICD-10-CM

## 2024-05-22 NOTE — Telephone Encounter (Signed)
 MRI order printed and faxed to [facility] at [phone].

## 2024-05-30 NOTE — Telephone Encounter (Signed)
 I reached out to entire pre-certification team via secure chat when I received this message. No response from any member of the team. I called [employer] and they do not allow change in facility to already approved. A new auth had to be obtained for new location, it was approved.  AUTHORIZATION # [id number] Valid through 05/30/24-11/26/24  CASE# [id number] Intake representative Traci B.  Called [facility] center and provided authorization information. Patient is scheduled for tomorrow morning.

## 2024-05-30 NOTE — Telephone Encounter (Signed)
 Inbound call from patient [facility] stating approval for patient's MRI from April needs to be changed to [facility] by 4:00 pm today. States to just call [employer] at [phone] to have approval changed. Also provided their NPI number of [id number]. Please advise, thank you

## 2024-07-25 ENCOUNTER — Inpatient Hospital Stay: Admission: RE | Admit: 2024-07-25 | Source: Ambulatory Visit

## 2025-01-05 ENCOUNTER — Encounter

## 2025-01-05 DIAGNOSIS — Z1231 Encounter for screening mammogram for malignant neoplasm of breast: Secondary | ICD-10-CM
